# Patient Record
Sex: Male | Born: 1965 | Race: White | Hispanic: No | Marital: Single | Smoking: Never smoker
Health system: Southern US, Community
[De-identification: ages and names within clinical notes are randomized; demographics above are authoritative.]

## PROBLEM LIST (undated history)

## (undated) DIAGNOSIS — T7840XA Allergy, unspecified, initial encounter: Secondary | ICD-10-CM

## (undated) DIAGNOSIS — R079 Chest pain, unspecified: Secondary | ICD-10-CM

## (undated) DIAGNOSIS — G4700 Insomnia, unspecified: Secondary | ICD-10-CM

## (undated) DIAGNOSIS — Z87442 Personal history of urinary calculi: Secondary | ICD-10-CM

## (undated) DIAGNOSIS — S5420XA Injury of radial nerve at forearm level, unspecified arm, initial encounter: Secondary | ICD-10-CM

## (undated) DIAGNOSIS — I10 Essential (primary) hypertension: Secondary | ICD-10-CM

## (undated) HISTORY — PX: LITHOTRIPSY: SUR834

## (undated) HISTORY — DX: Insomnia, unspecified: G47.00

## (undated) HISTORY — DX: Chest pain, unspecified: R07.9

## (undated) HISTORY — DX: Injury of radial nerve at forearm level, unspecified arm, initial encounter: S54.20XA

## (undated) HISTORY — PX: VASECTOMY: SHX75

## (undated) HISTORY — PX: URETEROSCOPY WITH HOLMIUM LASER LITHOTRIPSY: SHX6645

## (undated) HISTORY — DX: Allergy, unspecified, initial encounter: T78.40XA

## (undated) HISTORY — DX: Essential (primary) hypertension: I10

---
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
## 1999-08-19 HISTORY — PX: NASAL SEPTUM SURGERY: SHX37

## 2007-08-19 DIAGNOSIS — D126 Benign neoplasm of colon, unspecified: Secondary | ICD-10-CM

## 2007-08-19 HISTORY — DX: Benign neoplasm of colon, unspecified: D12.6

## 2008-02-28 ENCOUNTER — Ambulatory Visit (HOSPITAL_COMMUNITY): Admission: RE | Admit: 2008-02-28 | Discharge: 2008-02-28 | Payer: Self-pay | Admitting: Urology

## 2008-09-27 ENCOUNTER — Emergency Department (HOSPITAL_BASED_OUTPATIENT_CLINIC_OR_DEPARTMENT_OTHER): Admission: EM | Admit: 2008-09-27 | Discharge: 2008-09-27 | Payer: Self-pay | Admitting: Emergency Medicine

## 2018-08-19 ENCOUNTER — Encounter (HOSPITAL_COMMUNITY)
Admission: RE | Disposition: A | Discharge status: Home / Self Care | Payer: Self-pay | Source: Other Acute Inpatient Hospital | Attending: Urology

## 2018-08-19 ENCOUNTER — Ambulatory Visit (HOSPITAL_COMMUNITY)
Admission: RE | Admit: 2018-08-19 | Discharge: 2018-08-19 | Disposition: A | Discharge status: Home / Self Care | Payer: BLUE CROSS/BLUE SHIELD | Source: Other Acute Inpatient Hospital | Attending: Urology | Admitting: Urology

## 2018-08-19 ENCOUNTER — Other Ambulatory Visit: Payer: Self-pay | Admitting: Urology

## 2018-08-19 ENCOUNTER — Encounter (HOSPITAL_COMMUNITY): Payer: Self-pay | Admitting: General Practice

## 2018-08-19 ENCOUNTER — Ambulatory Visit (HOSPITAL_COMMUNITY): Payer: BLUE CROSS/BLUE SHIELD

## 2018-08-19 DIAGNOSIS — N201 Calculus of ureter: Secondary | ICD-10-CM | POA: Diagnosis present

## 2018-08-19 HISTORY — DX: Personal history of urinary calculi: Z87.442

## 2018-08-19 HISTORY — PX: EXTRACORPOREAL SHOCK WAVE LITHOTRIPSY: SHX1557

## 2018-08-19 SURGERY — LITHOTRIPSY, ESWL
Anesthesia: Moderate Sedation

## 2018-08-19 MED ORDER — CIPROFLOXACIN HCL 500 MG PO TABS
500.0000 mg | ORAL_TABLET | ORAL | Status: AC
  Administered 2018-08-19: 500 mg via ORAL
  Filled 2018-08-19: qty 1

## 2018-08-19 MED ORDER — DIAZEPAM 5 MG PO TABS
10.0000 mg | ORAL_TABLET | ORAL | Status: AC
  Administered 2018-08-19: 10 mg via ORAL
  Filled 2018-08-19: qty 2

## 2018-08-19 MED ORDER — DIPHENHYDRAMINE HCL 25 MG PO CAPS
25.0000 mg | ORAL_CAPSULE | ORAL | Status: AC
  Administered 2018-08-19: 25 mg via ORAL
  Filled 2018-08-19: qty 1

## 2018-08-19 MED ORDER — SODIUM CHLORIDE 0.9 % IV SOLN
INTRAVENOUS | Status: DC
  Administered 2018-08-19: 14:00:00 via INTRAVENOUS

## 2018-08-19 NOTE — Discharge Instructions (Signed)

## 2018-08-20 ENCOUNTER — Encounter (HOSPITAL_COMMUNITY): Payer: Self-pay | Admitting: Urology

## 2019-06-30 IMAGING — CR DG ABDOMEN 1V
1 series · 1 of 1 positions shown · non-contrast
Comparison: 8866 hours today. CT Abdomen and Pelvis [REDACTED] 02/11/2008.

CLINICAL DATA: 52-year-old male with right kidney stone.
Preoperative.

EXAM:
ABDOMEN - 1 VIEW

[t abdomen supine]
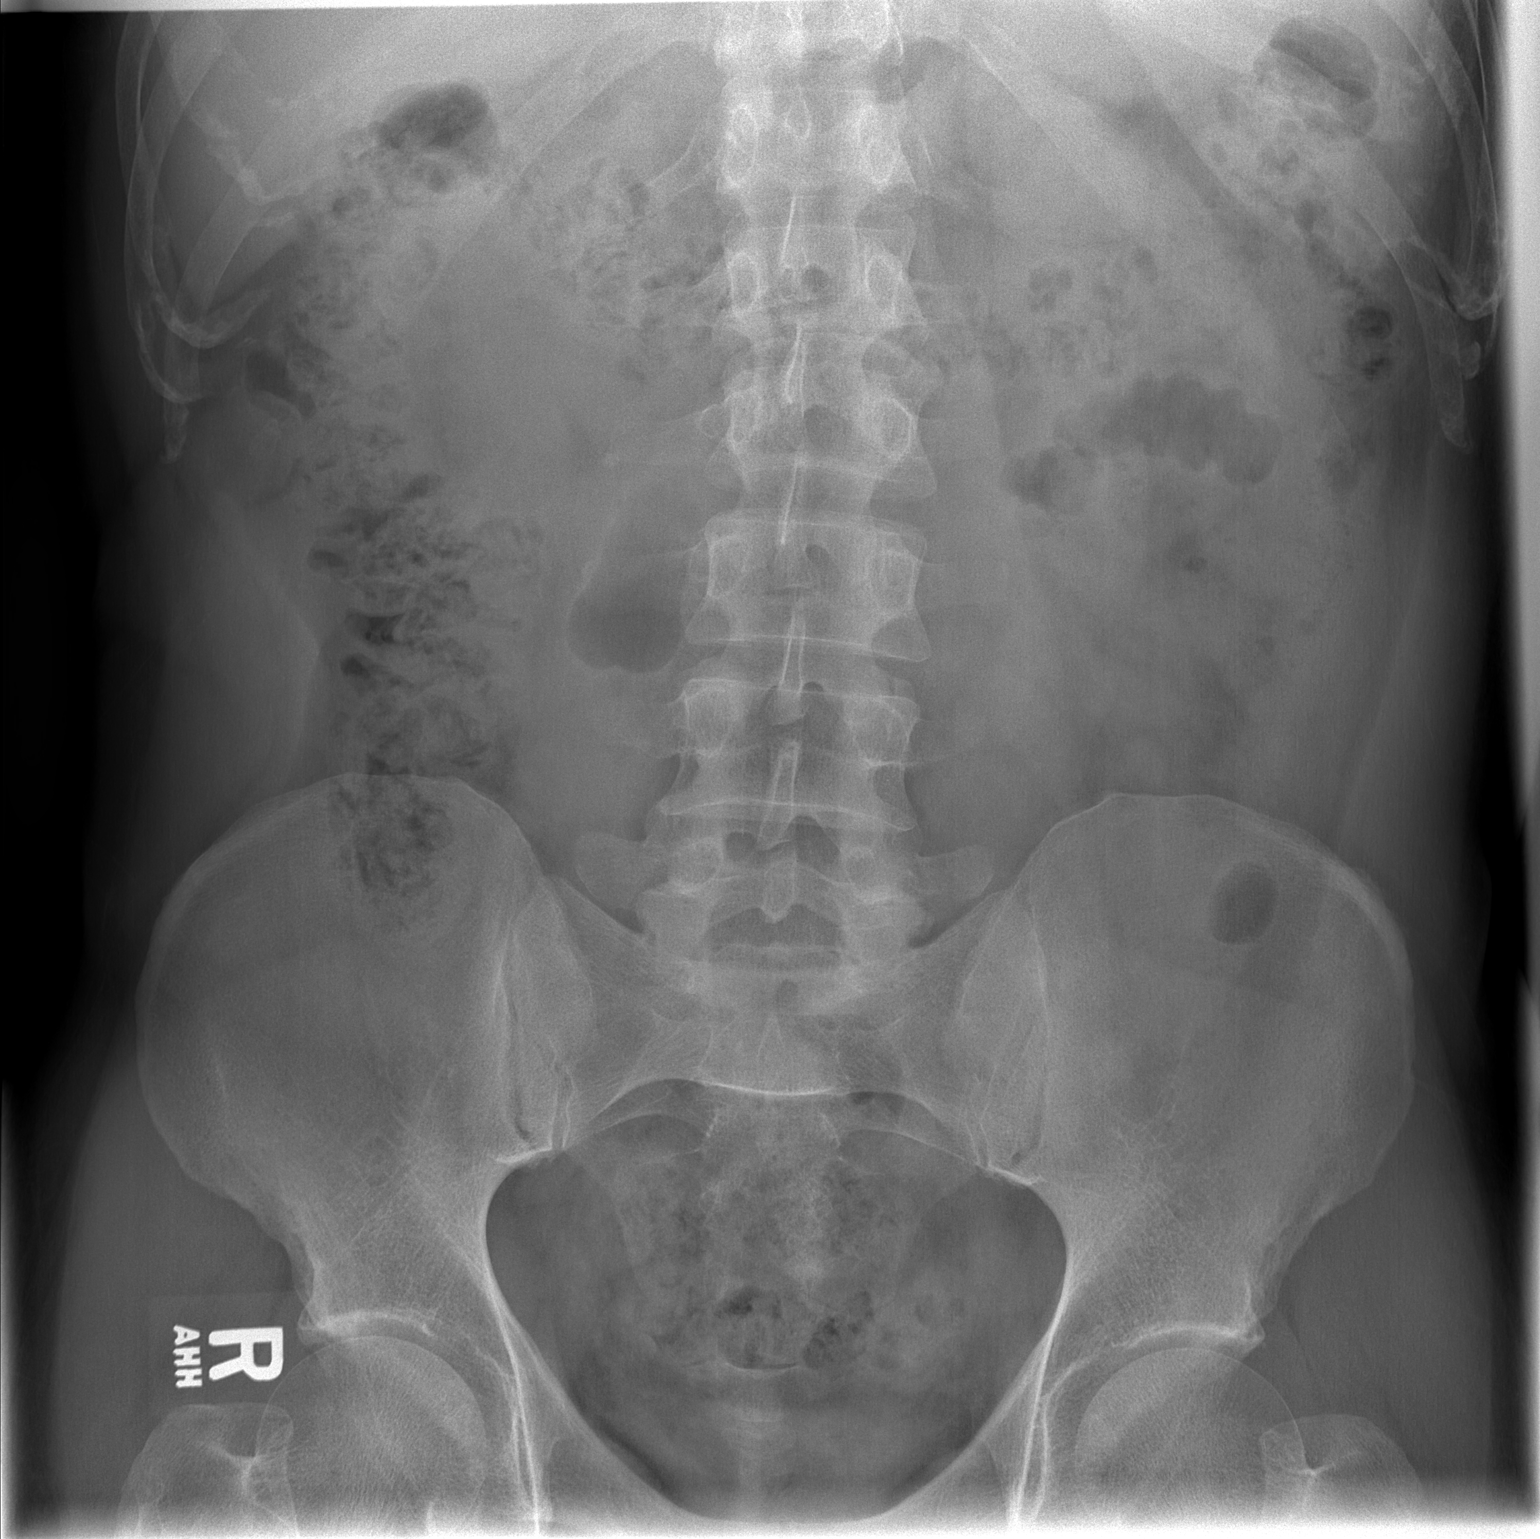

[1 of 1 positions shown; findings below may reference images not displayed]

FINDINGS: A round 4 millimeter calculus projects at the right L2 transverse
process along the expected course of the right ureter. No other
urologic calculus identified. Abdominal and pelvic visceral contours
and bowel gas pattern within normal limits. No acute osseous
abnormality identified.
IMPRESSION: 4 mm right ureteral calculus suspected at the L2 transverse process
level.

## 2020-02-22 ENCOUNTER — Other Ambulatory Visit: Payer: Self-pay

## 2020-02-22 ENCOUNTER — Encounter (INDEPENDENT_AMBULATORY_CARE_PROVIDER_SITE_OTHER): Payer: Self-pay | Admitting: Otolaryngology

## 2020-02-22 ENCOUNTER — Ambulatory Visit (INDEPENDENT_AMBULATORY_CARE_PROVIDER_SITE_OTHER): Payer: Managed Care, Other (non HMO) | Admitting: Otolaryngology

## 2020-02-22 VITALS — Temp 97.9°F

## 2020-02-22 DIAGNOSIS — J310 Chronic rhinitis: Secondary | ICD-10-CM

## 2020-02-22 NOTE — Progress Notes (Signed)
HPI: Jared Chen is a 54 y.o. male who presents for evaluation of intermittent nasal obstruction as well as some crusting and scabbing in his nose.  He had had previous septal surgery performed in [place] 20 years ago.  Over the past several months he has felt more nasal congestion especially on the left side but it will alternate from side to side.  He has also noticed some scabbing or crusting in the nose.  He feels like the septum has moved.  Past Medical History:  Diagnosis Date  . History of kidney stones    Past Surgical History:  Procedure Laterality Date  . EXTRACORPOREAL SHOCK WAVE LITHOTRIPSY N/A 08/19/2018   Procedure: EXTRACORPOREAL SHOCK WAVE LITHOTRIPSY (ESWL);  Surgeon: Cleon Gustin, MD;  Location: [facility];  Service: Urology;  Laterality: N/A;  . LITHOTRIPSY     multiple  . [place]   had collasped lung with surgery  . URETEROSCOPY WITH HOLMIUM LASER LITHOTRIPSY     x2,  . VASECTOMY  2002- or 2003   Social History   Socioeconomic History  . Marital status: Married    Spouse name: Not on file  . Number of children: Not on file  . Years of education: Not on file  . Highest education level: Not on file  Occupational History  . Not on file  Tobacco Use  . Smoking status: Never Smoker  . Smokeless tobacco: Never Used  Vaping Use  . Vaping Use: Never used  Substance and Sexual Activity  . Alcohol use: Not on file  . Drug use: Not on file  . Sexual activity: Not on file  Other Topics Concern  . Not on file  Social History Narrative  . Not on file   Social Determinants of Health   Financial Resource Strain:   . Difficulty of Paying Living Expenses:   Food Insecurity:   . Worried About Charity fundraiser in the Last Year:   . Arboriculturist in the Last Year:   Transportation Needs:   . Film/video editor (Medical):   Marland Kitchen Lack of Transportation (Non-Medical):   Physical Activity:   . Days of Exercise per Week:   . Minutes of  Exercise per Session:   Stress:   . Feeling of Stress :   Social Connections:   . Frequency of Communication with Friends and Family:   . Frequency of Social Gatherings with Friends and Family:   . Attends Religious Services:   . Active Member of Clubs or Organizations:   . Attends Archivist Meetings:   Marland Kitchen Marital Status:    No family history on file. Allergies  Allergen Reactions  . Demerol [Meperidine Hcl] Other (See Comments)    Explosive headache, vomiting   Prior to Admission medications   Not on File     Positive ROS: Otherwise negative  All other systems have been reviewed and were otherwise negative with the exception of those mentioned in the HPI and as above.  Physical Exam: Constitutional: Alert, well-appearing, no acute distress Ears: External ears without lesions or tenderness. Ear canals are clear bilaterally with intact, clear TMs.  Nasal: External nose without lesions.  Septum is relatively midline with no significant deviation.  Turbinates are mid size bilaterally and nasal passages are clear in the office today.  He does have some crusting along the septum little bit worse on the left side.  He also has some crusting along the inferior edge of  the nostril on both sides.  The nose was cleaned in the office today. Oral: Lips and gums without lesions. Tongue and palate mucosa without lesions. Posterior oropharynx clear. Neck: No palpable adenopathy or masses Respiratory: Breathing comfortably  Skin: No facial/neck lesions or rash noted.  Procedures  Assessment: Status post previous septoplasty which looks to be in good shape. Crusting and scabbing within the nose on both sides. Medium size turbinates I suspect this contributes to alternating congestion in his nose.  Plan: Recommended use of Nasacort 2 sprays each nostril at night as well as use of saline irrigation as needed. For the scabbing or crusting in the nose suggested use of mupirocin 2%  ointment application twice daily for 7 to 10 days. He will follow-up as needed.  If congestion becomes a major issue could consider further turbinate reduction.  Radene Journey, MD

## 2020-04-05 ENCOUNTER — Other Ambulatory Visit: Payer: Self-pay | Admitting: Gastroenterology

## 2020-04-05 ENCOUNTER — Other Ambulatory Visit (HOSPITAL_COMMUNITY): Payer: Self-pay | Admitting: Gastroenterology

## 2020-04-05 DIAGNOSIS — R1011 Right upper quadrant pain: Secondary | ICD-10-CM

## 2020-04-16 ENCOUNTER — Encounter (HOSPITAL_COMMUNITY): Payer: Managed Care, Other (non HMO)

## 2020-04-16 ENCOUNTER — Encounter (HOSPITAL_COMMUNITY): Payer: Self-pay

## 2020-05-10 ENCOUNTER — Telehealth: Payer: Self-pay | Admitting: Gastroenterology

## 2020-05-10 NOTE — Telephone Encounter (Addendum)
Hi Dr. Fuller Plan,  Patient is requesting to transfer care over to you was referred by a few friends that are under your care. Patient is currently with [facility] and is having upper right abdominal pain also stated he has had gallbladder issues in the past. Sending records to you for review.  Please advise on scheduling.   Thank you

## 2020-05-11 ENCOUNTER — Other Ambulatory Visit: Payer: Self-pay | Admitting: Nurse Practitioner

## 2020-05-11 DIAGNOSIS — R1011 Right upper quadrant pain: Secondary | ICD-10-CM

## 2020-05-15 ENCOUNTER — Other Ambulatory Visit: Payer: Self-pay

## 2020-05-15 ENCOUNTER — Ambulatory Visit (INDEPENDENT_AMBULATORY_CARE_PROVIDER_SITE_OTHER): Payer: Managed Care, Other (non HMO)

## 2020-05-15 DIAGNOSIS — R1011 Right upper quadrant pain: Secondary | ICD-10-CM | POA: Diagnosis not present

## 2020-05-15 MED ORDER — IOHEXOL 300 MG/ML  SOLN
100.0000 mL | Freq: Once | INTRAMUSCULAR | Status: AC | PRN
  Administered 2020-05-15: 100 mL via INTRAVENOUS

## 2020-07-17 ENCOUNTER — Ambulatory Visit: Payer: Managed Care, Other (non HMO) | Admitting: Gastroenterology

## 2020-07-17 ENCOUNTER — Encounter: Payer: Self-pay | Admitting: Gastroenterology

## 2020-07-17 ENCOUNTER — Telehealth: Payer: Self-pay | Admitting: Gastroenterology

## 2020-07-17 ENCOUNTER — Other Ambulatory Visit: Payer: Self-pay | Admitting: Gastroenterology

## 2020-07-17 VITALS — BP 140/90 | HR 88 | Ht 69.5 in | Wt 197.0 lb

## 2020-07-17 DIAGNOSIS — R748 Abnormal levels of other serum enzymes: Secondary | ICD-10-CM | POA: Diagnosis not present

## 2020-07-17 DIAGNOSIS — R109 Unspecified abdominal pain: Secondary | ICD-10-CM

## 2020-07-17 DIAGNOSIS — Z8371 Family history of colonic polyps: Secondary | ICD-10-CM | POA: Diagnosis not present

## 2020-07-17 DIAGNOSIS — Z86010 Personal history of colonic polyps: Secondary | ICD-10-CM | POA: Diagnosis not present

## 2020-07-17 MED ORDER — GLYCOPYRROLATE 1 MG PO TABS: 1.0000 mg | ORAL_TABLET | Freq: Two times a day (BID) | ORAL | 11 refills | Status: DC

## 2020-07-17 NOTE — Telephone Encounter (Signed)
Patient called, stating he was returning your call.  Please call him again.

## 2020-07-17 NOTE — Addendum Note (Signed)
Addended by: Dorisann Frames L on: 07/17/2020 01:50 PM   Modules accepted: Orders

## 2020-07-17 NOTE — Progress Notes (Signed)
History of Present Illness: This is a 54 year old male referred by Christella Noa, MD for the evaluation of right-sided abdominal pain and elevated alkaline phosphatase.  He was previously followed by Dr. Otis Brace with [facility] and is transferring care.  He relates a 84-monthhistory of right mid abdominal pain that generally follows meals but does bother him at other times.  It frequently occurs with larger meals or overeating.  Colonoscopy and EGD in 2019 as below.  Recent evaluation with ultrasound, HIDA and CT abdomen and pelvis unremarkable.  He states his symptoms have improved over the past few weeks and he is now having them intermittently.  Initially symptoms were almost on a daily basis.  Alkaline phosphatase of 155 was noted in August with normal range to 121. Other LFTs were normal.  Repeat alk phos in August was 149.  Gamma GT normal, ASMA negative, AMA negative, HCV antibody negative, HBsAg negative. Denies weight loss, constipation, diarrhea, change in stool caliber, melena, hematochezia, nausea, vomiting, dysphagia, reflux symptoms, chest pain.   CT AP 05/15/2020 negative HIDA 05/07/2020 EF=84%  RUQ [place]20[place] negative  EGD July 2019  Nonobstructive Schatkzi ring, small HH, gastritis Biopsies negative for H pylori and EoE Colonoscopy July 2019 2 tubular adenomas    Allergies  Allergen Reactions  . Demerol [Meperidine Hcl] Other (See Comments)    Explosive headache, vomiting   Outpatient Medications Prior to Visit  Medication Sig Dispense Refill  . Multiple Vitamin (MULTIVITAMIN) tablet Take 1 tablet by mouth daily.    . Omega-3 Fatty Acids (FISH OIL) 1000 MG CAPS Take 2 capsules by mouth daily.     No facility-administered medications prior to visit.   Past Medical History:  Diagnosis Date  . Adenomatous colon polyp 2009  . History of kidney stones   . Insomnia   . Radial nerve injury    Past Surgical History:  Procedure Laterality Date  . EXTRACORPOREAL  SHOCK WAVE LITHOTRIPSY N/A 08/19/2018   Procedure: EXTRACORPOREAL SHOCK WAVE LITHOTRIPSY (ESWL);  Surgeon: MCleon Gustin MD;  Location: [facility];  Service: Urology;  Laterality: N/A;  . LITHOTRIPSY     multiple  . [place]AL SE[place]  [place] with surgery  . URETEROSCOPY WITH HOLMIUM LASER LITHOTRIPSY     x2,  . VASECTOMY  2002- or 2003   Social History   Socioeconomic History  . Marital status: Married    Spouse name: Not on file  . Number of children: Not on file  . Years of education: Not on file  . Highest education level: Not on file  Occupational History  . Not on file  Tobacco Use  . Smoking status: Never Smoker  . Smokeless tobacco: Never Used  Vaping Use  . Vaping Use: Never used  Substance and Sexual Activity  . Alcohol use: Never  . Drug use: Never  . Sexual activity: Not on file  Other Topics Concern  . Not on file  Social History Narrative  . Not on file   Social Determinants of Health   Financial Resource Strain:   . Difficulty of Paying Living Expenses: Not on file  Food Insecurity:   . Worried About RCharity fundraiserin the Last Year: Not on file  . Ran Out of Food in the Last Year: Not on file  Transportation Needs:   . Lack of Transportation (Medical): Not on file  . Lack of  Transportation (Non-Medical): Not on file  Physical Activity:   . Days of Exercise per Week: Not on file  . Minutes of Exercise per Session: Not on file  Stress:   . Feeling of Stress : Not on file  Social Connections:   . Frequency of Communication with Friends and Family: Not on file  . Frequency of Social Gatherings with Friends and Family: Not on file  . Attends Religious Services: Not on file  . Active Member of Clubs or Organizations: Not on file  . Attends Archivist Meetings: Not on file  . Marital Status: Not on file   Family History  Problem Relation Age of Onset  . Depression Mother   . Breast cancer  Maternal Grandmother   . Arthritis Maternal Grandmother   . Hypertension Maternal Grandmother   . Colon cancer Maternal Grandfather   . Arthritis Maternal Grandfather   . Hypertension Maternal Grandfather   . Alzheimer's disease Maternal Grandfather   . Leukemia Paternal Grandmother       Review of Systems: Pertinent positive and negative review of systems were noted in the above HPI section. All other review of systems were otherwise negative.   Physical Exam: General: Well developed, well nourished, no acute distress Head: Normocephalic and atraumatic Eyes:  sclerae anicteric, EOMI Ears: Normal auditory acuity Mouth: Not examined, mask on during Covid-19 pandemic Neck: Supple, no masses or thyromegaly Lungs: Clear throughout to auscultation Heart: Regular rate and rhythm; no murmurs, rubs or bruits Abdomen: Soft, non tender and non distended. Right abdomen/right flank well healed scar. No masses, hepatosplenomegaly or hernias noted. Normal Bowel sounds Rectal: Not done Musculoskeletal: Symmetrical with no gross deformities  Skin: No lesions on visible extremities Pulses:  Normal pulses noted Extremities: No clubbing, cyanosis, edema or deformities noted Neurological: Alert oriented x 4, grossly nonfocal Cervical Nodes:  No significant cervical adenopathy Inguinal Nodes: No significant inguinal adenopathy Psychological:  Alert and cooperative. Normal mood and affect   Assessment and Recommendations:  1. Right sided abdominal pain.  Rule out celiac disease, dyspepsia, functional abdominal pain, adhesions, occult biliary disease. Check tTG and IgA.  Trial of glycopyrrolate 1 mg p.o. twice daily.  Advised to avoid overeating and, if possible, avoid foods that frequently cause symptoms. Keep track of foods, beverages that might be causing symptoms.  If symptoms are not controlled with glycopyrrolate consider changing antispasmodics and consider EGD.  REV in 6 weeks.   2. Mildly  elevated alk phos. Repeat LFTs and obtain alkaline phosphatase isoenzymes.  3. Personal history of adenomatous colon polyps, family history of colon polyps, mother, family history of colon cancer, MGF and M uncle. Surveillance colonoscopy in July 2024.   cc: Christella Noa, MD

## 2020-07-17 NOTE — Addendum Note (Signed)
Addended by: Dorisann Frames L on: 07/17/2020 02:38 PM   Modules accepted: Orders

## 2020-07-17 NOTE — Patient Instructions (Signed)
Your provider has requested that you get labs drawn. Per your request, you will take the lab order requisitions to [facility]. Please have them fax results to [place] at [phone].  We have sent the following medications to your pharmacy for you to pick up at your convenience: glycopyrrolate.   Thank you for choosing me and [facility].  Pricilla Riffle. Dagoberto Ligas., MD., [place] Regal

## 2020-07-17 NOTE — Telephone Encounter (Signed)
Informed patient I have ordered a test that was put in error. Asked patient if he can come by the [place] lab again to have the right test drawn. Informed patient that I will credit the test I ordered in error. Patient states he works for [employer] and he lab work is free. He said to just email him the new lab order requisition and he will go by the lab again. Thanks patient and told him I will email the requisition today. Informed patient to contact our office if he does not receive it. Patient verbalized understanding.

## 2020-07-19 LAB — HEPATIC FUNCTION PANEL
ALT: 36 IU/L (ref 0–44)
AST: 32 IU/L (ref 0–40)
Albumin: 4.8 g/dL (ref 3.8–4.9)
Alkaline Phosphatase: 143 IU/L — ABNORMAL HIGH (ref 44–121)
Bilirubin Total: 0.6 mg/dL (ref 0.0–1.2)
Bilirubin, Direct: 0.14 mg/dL (ref 0.00–0.40)
Total Protein: 6.8 g/dL (ref 6.0–8.5)

## 2020-07-19 LAB — AMYLASE ISOENZYMES
Amylase: 79 U/L (ref 31–110)
Pancreatic Amylase, S: 58 U/L — ABNORMAL HIGH (ref 14–55)
Salivary Amyl. Calc.: 21 U/L (ref 11–83)

## 2020-07-19 LAB — TISSUE TRANSGLUTAMINASE, IGA: Transglutaminase IgA: <2 U/mL (ref 0–3)

## 2020-07-19 LAB — IGA: IgA/Immunoglobulin A, Serum: 192 mg/dL (ref 90–386)

## 2020-07-24 ENCOUNTER — Other Ambulatory Visit: Payer: Self-pay

## 2020-07-24 DIAGNOSIS — R748 Abnormal levels of other serum enzymes: Secondary | ICD-10-CM

## 2020-07-24 LAB — ALKALINE PHOSPHATASE, ISOENZYMES
Alkaline Phosphatase: 152 IU/L — ABNORMAL HIGH (ref 44–121)
BONE FRACTION: 57 % (ref 12–68)
INTESTINAL FRAC.: 10 % (ref 0–18)
LIVER FRACTION: 33 % (ref 13–88)

## 2020-08-29 ENCOUNTER — Ambulatory Visit: Payer: Managed Care, Other (non HMO) | Admitting: Gastroenterology

## 2020-09-25 ENCOUNTER — Ambulatory Visit: Payer: Managed Care, Other (non HMO) | Admitting: Gastroenterology

## 2020-09-25 ENCOUNTER — Encounter: Payer: Self-pay | Admitting: Gastroenterology

## 2020-09-25 VITALS — BP 130/100 | HR 88 | Ht 69.5 in | Wt 206.0 lb

## 2020-09-25 DIAGNOSIS — R1011 Right upper quadrant pain: Secondary | ICD-10-CM | POA: Diagnosis not present

## 2020-09-25 DIAGNOSIS — R748 Abnormal levels of other serum enzymes: Secondary | ICD-10-CM | POA: Diagnosis not present

## 2020-09-25 MED ORDER — DICYCLOMINE HCL 10 MG PO CAPS: 10.0000 mg | ORAL_CAPSULE | Freq: Three times a day (TID) | ORAL | 11 refills | Status: DC | PRN

## 2020-09-25 NOTE — Progress Notes (Signed)
    History of Present Illness: This is a 55 year old male returning for follow-up of right upper quadrant pain and an elevated alkaline phosphatase.  He has adjusted his diet and feels he is under less stress and he has noted a substantial improvement in his symptoms.  He notes that Robinul was moderately effective in reducing his pain however he had significant dry mouth and so he discontinued it.   Current Medications, Allergies, Past Medical History, Past Surgical History, Family History and Social History were reviewed in [employer] record.   Physical Exam: General: Well developed, well nourished, no acute distress Head: Normocephalic and atraumatic Eyes: Sclerae anicteric, EOMI Ears: Normal auditory acuity Mouth: Not examined, mask on during Covid-19 pandemic Lungs: Clear throughout to auscultation Heart: Regular rate and rhythm; no murmurs, rubs or bruits Abdomen: Soft, non tender and non distended. No masses, hepatosplenomegaly or hernias noted. Normal Bowel sounds Rectal: Not done Musculoskeletal: Symmetrical with no gross deformities  Pulses:  Normal pulses noted Extremities: No clubbing, cyanosis, edema or deformities noted Neurological: Alert oriented x 4, grossly nonfocal Psychological:  Alert and cooperative. Normal mood and affect   Assessment and Recommendations:  1.  Right-sided abdominal pain.  Continue with current diet adjustments.  If additional foods or beverages are found to aggravate symptoms he will adjust his diet as appropriate.  Trial of dicyclomine 10 mg p.o. 3 times daily taken 30 minutes before meals as needed.  If this is not effective or he notes side effects trial of IBgard 1-2 p.o. 3 times daily 30 minutes AC as needed. REV in 1 year.   2.  Mildly elevated alkaline phosphatase with normal liver bone and intestinal fractions and normal CT AP.  Repeat LFTs today.

## 2020-09-25 NOTE — Patient Instructions (Addendum)
Your provider has requested that you go to the basement level for lab work before leaving today. Press "B" on the elevator. The lab is located at the first door on the left as you exit the elevator.  We have sent the following medications to your pharmacy for you to pick up at your convenience: dicyclomine.   If you do not see improvement in your symptoms, then start over the counter IBgard 1-2 capsules daily as needed.   Due to recent changes in healthcare laws, you may see the results of your imaging and laboratory studies on MyChart before your provider has had a chance to review them.  We understand that in some cases there may be results that are confusing or concerning to you. Not all laboratory results come back in the same time frame and the provider may be waiting for multiple results in order to interpret others.  Please give [place] 48 hours in order for your provider to thoroughly review all the results before contacting the office for clarification of your results.   Thank you for choosing me and [facility].  Pricilla Riffle. Dagoberto Ligas., MD., [place] Regal

## 2021-05-21 ENCOUNTER — Encounter (HOSPITAL_BASED_OUTPATIENT_CLINIC_OR_DEPARTMENT_OTHER): Payer: Self-pay | Admitting: Emergency Medicine

## 2021-05-21 ENCOUNTER — Other Ambulatory Visit: Payer: Self-pay

## 2021-05-21 ENCOUNTER — Emergency Department (HOSPITAL_BASED_OUTPATIENT_CLINIC_OR_DEPARTMENT_OTHER): Payer: Managed Care, Other (non HMO)

## 2021-05-21 ENCOUNTER — Emergency Department (HOSPITAL_BASED_OUTPATIENT_CLINIC_OR_DEPARTMENT_OTHER)
Admission: EM | Admit: 2021-05-21 | Discharge: 2021-05-21 | Disposition: A | Discharge status: Home / Self Care | Payer: Managed Care, Other (non HMO) | Attending: Emergency Medicine | Admitting: Emergency Medicine

## 2021-05-21 DIAGNOSIS — R072 Precordial pain: Secondary | ICD-10-CM | POA: Insufficient documentation

## 2021-05-21 DIAGNOSIS — R079 Chest pain, unspecified: Secondary | ICD-10-CM

## 2021-05-21 DIAGNOSIS — Z79899 Other long term (current) drug therapy: Secondary | ICD-10-CM | POA: Insufficient documentation

## 2021-05-21 LAB — HEPATIC FUNCTION PANEL
ALT: 22 U/L (ref 0–44)
AST: 18 U/L (ref 15–41)
Albumin: 4.6 g/dL (ref 3.5–5.0)
Alkaline Phosphatase: 111 U/L (ref 38–126)
Bilirubin, Direct: 0.2 mg/dL (ref 0.0–0.2)
Indirect Bilirubin: 0.5 mg/dL (ref 0.3–0.9)
Total Bilirubin: 0.7 mg/dL (ref 0.3–1.2)
Total Protein: 7.9 g/dL (ref 6.5–8.1)

## 2021-05-21 LAB — BASIC METABOLIC PANEL
Anion gap: 10 (ref 5–15)
BUN: 19 mg/dL (ref 6–20)
CO2: 26 mmol/L (ref 22–32)
Calcium: 9.7 mg/dL (ref 8.9–10.3)
Chloride: 102 mmol/L (ref 98–111)
Creatinine, Ser: 1.06 mg/dL (ref 0.61–1.24)
GFR, Estimated: >60 mL/min (ref >60)
Glucose, Bld: 110 mg/dL — ABNORMAL HIGH (ref 70–99)
Potassium: 4 mmol/L (ref 3.5–5.1)
Sodium: 138 mmol/L (ref 135–145)

## 2021-05-21 LAB — CBC
HCT: 49.2 % (ref 39.0–52.0)
Hemoglobin: 16.3 g/dL (ref 13.0–17.0)
MCH: 29.9 pg (ref 26.0–34.0)
MCHC: 33.1 g/dL (ref 30.0–36.0)
MCV: 90.3 fL (ref 80.0–100.0)
Platelets: 223 10*3/uL (ref 150–400)
RBC: 5.45 MIL/uL (ref 4.22–5.81)
RDW: 13.3 % (ref 11.5–15.5)
WBC: 6.7 10*3/uL (ref 4.0–10.5)
nRBC: 0 % (ref 0.0–0.2)

## 2021-05-21 LAB — TROPONIN I (HIGH SENSITIVITY)
Troponin I (High Sensitivity): 3 ng/L (ref <18)
Troponin I (High Sensitivity): 3 ng/L

## 2021-05-21 LAB — LIPASE, BLOOD: Lipase: 13 U/L (ref 11–51)

## 2021-05-21 LAB — CBG MONITORING, ED: Glucose-Capillary: 119 mg/dL — ABNORMAL HIGH (ref 70–99)

## 2021-05-21 MED ORDER — LIDOCAINE VISCOUS HCL 2 % MT SOLN
15.0000 mL | Freq: Once | OROMUCOSAL | Status: AC
  Administered 2021-05-21: 15 mL via ORAL
  Filled 2021-05-21: qty 15

## 2021-05-21 MED ORDER — ALUM & MAG HYDROXIDE-SIMETH 200-200-20 MG/5ML PO SUSP
30.0000 mL | Freq: Once | ORAL | Status: AC
  Administered 2021-05-21: 30 mL via ORAL
  Filled 2021-05-21: qty 30

## 2021-05-21 NOTE — Discharge Instructions (Addendum)
Please follow-up with your primary care provider for consideration for cardiac stress testing outpatient.  Additionally consider further discussion of options regarding chronic blood pressure management

## 2021-05-21 NOTE — ED Triage Notes (Signed)
Pt arrives to ED with c/o of chest pain that started today. The CP is dull and tight. The CP is consistent. Pt reports his acid reflex has worsened over the past x4 days and this weekend he noticed to have polyuria. He reports being congested. No rhinorrhea, sore throat, post nasal drip. No SOB.

## 2021-05-21 NOTE — ED Provider Notes (Signed)
[facility] EMERGENCY DEPT Provider Note   CSN: [id number] Arrival date & time: 05/21/21  0754     History Chief Complaint  Patient presents with   Chest Pain    Jared Chen is a 55 y.o. male.   Chest Pain Pain location:  Substernal area and epigastric Pain quality: pressure and tightness   Pain radiates to:  Does not radiate Pain severity:  Mild Onset quality:  Sudden Timing:  Constant Progression:  Unchanged Chronicity:  New Context: at rest   Context: not breathing and not movement   Relieved by:  Nothing Worsened by:  Nothing Ineffective treatments:  None tried Associated symptoms: heartburn   Associated symptoms: no altered mental status, no back pain, no cough, no diaphoresis, no dizziness, no fever, no headache, no lower extremity edema, no nausea, no shortness of breath, no syncope and no vomiting   Risk factors: high cholesterol and male sex   Risk factors: no coronary artery disease, no diabetes mellitus, no hypertension and no smoking    55 year old male with medical history of below presenting to the emergency department with chest pressure.  The patient states that over the past 4 days he noticed some "indigestion.  He did endorse some epigastric and generalized abdominal crampy discomfort over that time. He was seen in urgent care yesterday where he was swabbed for COVID and flu which resulted negative. He presents today more for chest pressure that came on while driving to work.  He endorses a tightness and heaviness which is new.  He denies any chest pain with exertion.  He denies any shortness of breath, nausea, vomiting, diaphoresis, abdominal pain.  He denies any fevers or chills.  There are no aggravating or alleviating factors.  Past Medical History:  Diagnosis Date   Adenomatous colon polyp 2009   History of kidney stones    Insomnia    Radial nerve injury     There are no problems to display for this patient.   Past Surgical  History:  Procedure Laterality Date   EXTRACORPOREAL SHOCK WAVE LITHOTRIPSY N/A 08/19/2018   Procedure: EXTRACORPOREAL SHOCK WAVE LITHOTRIPSY (ESWL);  Surgeon: Cleon Gustin, MD;  Location: [facility];  Service: Urology;  Laterality: N/A;   LITHOTRIPSY     multiple   NASAL SEPTUM SURGERY  2001   [place]   had collasped lung with surgery   URETEROSCOPY WITH HOLMIUM LASER LITHOTRIPSY     x2,   VASECTOMY  2002- or 2003       Family History  Problem Relation Age of Onset   Depression Mother    Breast cancer Maternal Grandmother    Arthritis Maternal Grandmother    Hypertension Maternal Grandmother    Colon cancer Maternal Grandfather    Arthritis Maternal Grandfather    Hypertension Maternal Grandfather    Alzheimer's disease Maternal Grandfather    Leukemia Paternal Grandmother     Social History   Tobacco Use   Smoking status: Never   Smokeless tobacco: Never  Vaping Use   Vaping Use: Never used  Substance Use Topics   Alcohol use: Never   Drug use: Never    Home Medications Prior to Admission medications   Medication Sig Start Date End Date Taking? Authorizing Provider  acetaminophen (TYLENOL) 325 MG tablet as needed.    [provider]  celecoxib (CELEBREX) 200 MG capsule as needed. 05/29/15   [provider]  dicyclomine (BENTYL) 10 MG capsule Take 1 capsule (10 mg  total) by mouth 3 (three) times daily as needed for spasms. 09/25/20   Ladene Artist, MD  Multiple Vitamin (MULTIVITAMIN) tablet Take 1 tablet by mouth daily.    [provider]  Omega-3 Fatty Acids (FISH OIL) 1000 MG CAPS Take 2 capsules by mouth daily.    [provider]    Allergies    Demerol [meperidine hcl]  Review of Systems   Review of Systems  Constitutional:  Negative for diaphoresis and fever.  Respiratory:  Negative for cough and shortness of breath.   Cardiovascular:  Positive for chest pain. Negative for syncope.  Gastrointestinal:   Positive for heartburn. Negative for nausea and vomiting.  Musculoskeletal:  Negative for back pain.  Neurological:  Negative for dizziness and headaches.  All other systems reviewed and are negative.  Physical Exam Updated Vital Signs BP (!) 145/100   Pulse 80   Temp 97.9 F (36.6 C) (Oral)   Resp 18   Ht 5\' 9"  (1.753 m)   Wt 90.7 kg   SpO2 98%   BMI 29.53 kg/m   Physical Exam Vitals and nursing note reviewed.  Constitutional:      General: He is not in acute distress.    Appearance: He is well-developed.  HENT:     Head: Normocephalic and atraumatic.  Eyes:     Conjunctiva/sclera: Conjunctivae normal.     Pupils: Pupils are equal, round, and reactive to light.  Cardiovascular:     Rate and Rhythm: Normal rate and regular rhythm.     Heart sounds: Normal heart sounds. No murmur heard. Pulmonary:     Effort: Pulmonary effort is normal. No respiratory distress.     Breath sounds: Normal breath sounds.  Abdominal:     General: There is no distension.     Palpations: Abdomen is soft.     Tenderness: There is no abdominal tenderness. There is no guarding.  Musculoskeletal:        General: No deformity or signs of injury.     Cervical back: Normal range of motion and neck supple.  Skin:    General: Skin is warm and dry.     Findings: No lesion or rash.  Neurological:     General: No focal deficit present.     Mental Status: He is alert. Mental status is at baseline.    ED Results / Procedures / Treatments   Labs (all labs ordered are listed, but only abnormal results are displayed) Labs Reviewed  BASIC METABOLIC PANEL - Abnormal; Notable for the following components:      Result Value   Glucose, Bld 110 (*)    All other components within normal limits  CBG MONITORING, ED - Abnormal; Notable for the following components:   Glucose-Capillary 119 (*)    All other components within normal limits  CBC  LIPASE, BLOOD  HEPATIC FUNCTION PANEL  TROPONIN I (HIGH  SENSITIVITY)  TROPONIN I (HIGH SENSITIVITY)    EKG EKG Interpretation  Date/Time:  Tuesday May 21 2021 08:03:42 EDT Ventricular Rate:  83 PR Interval:  173 QRS Duration: 101 QT Interval:  375 QTC Calculation: 441 R Axis:   -36 Text Interpretation: Sinus rhythm Left axis deviation Confirmed by Regan Lemming ([id number]) on 05/21/2021 8:04:45 AM  Radiology DG Chest Portable 1 View  Result Date: 05/21/2021 CLINICAL DATA:  Pt arrives to ED with c/o of chest pain that started today. The CP is dull and tight. The CP is consistent. Pt reports his acid reflex  has worsened over the past x4 days and this weekend he noticed to have polyuria. He reports being congested. EXAM: PORTABLE CHEST 1 VIEW COMPARISON:  12/11/2010 FINDINGS: Cardiac silhouette is normal in size and configuration. Normal mediastinal and hilar contours. Lungs are clear.  No pleural effusion or pneumothorax. Skeletal structures are grossly intact. IMPRESSION: No active disease. Electronically Signed   By: Lajean Manes M.D.   On: 05/21/2021 08:26    Procedures Procedures   Medications Ordered in ED Medications  alum & mag hydroxide-simeth (MAALOX/MYLANTA) 200-200-20 MG/5ML suspension 30 mL (30 mLs Oral Given 05/21/21 0916)    And  lidocaine (XYLOCAINE) 2 % viscous mouth solution 15 mL (15 mLs Oral Given 05/21/21 9233)    ED Course  I have reviewed the triage vital signs and the nursing notes.  Pertinent labs & imaging results that were available during my care of the patient were reviewed by me and considered in my medical decision making (see chart for details).    MDM Rules/Calculators/A&P HEAR Score: 3                         Jared Chen is a 55 y.o. male who presented to the Emergency Department c/o chest pain. Past medical records have been reviewed and are notable for no significant cardiac hx.   Pertinent exam findings include:well appearing, NAD, lungs CTAB, abd s/nt/nd, heart sounds rrr no m/r/g.  On arrival,  the patient was afebrile, mildly hypertensive BP 158/116, not tachycardic or tachypneic, saturating 100% on room air.  EKG: Normal sinus rhythm with a rate of 83 and no evidence of acute ischemic changes, abnormal intervals, or dysrhythmia. No priors for comparison.   Lab results include: Blood glucose 119, lipase normal, CBC without anemia, leukocytosis or platelet abnormality, troponins x2 normal, BMP unremarkable, hepatic function panel unremarkable.  Imaging results include: Chest x-ray without active disease.  Course of tx has consisted of: Viscous Maalox and lidocaine.   Labs unremarkable.  Unlikely pneumonia, no cough, no leukocytosis, no fevers, CXR and exam without acute findings. Unlikely pneumothorax, no findings on  CXR. Unlikely pericarditis/myocarditis, does not fit clinical picture. Chest pain not exertional or pleuritic. Unlikely dissection, no pulse deficit, no tearing chest pain, no neurologic complaints. Due to patient's high risk will rule out ACS. Symptoms not consistent with PE.  Thought process: Patient presenting with symptoms of chest pressure earlier today.  No known history of exertional chest pain symptoms.  Differential diagnosis includes: ACS, pneumonia, pneumothorax,pericarditis/myocarditis, GERD, PUD, musculoskeletal, anxiety. HEART score of 3, low risk.   Patient's clinical presentation is most consistent with chest pain, unspecified type.  I discussed with the patient a plan for follow-up with his PCP to discuss further provocative testing such as cardiac stress testing.  Provided return precautions in the event of worsening symptoms.  Patient was mildly hypertensive with a BP of 158/116 today.  Discussed further follow-up with his PCP for blood pressure recheck and in discussion regarding further options regarding chronic blood pressure management.  Given reassuring work-up today, feel the patient is overall stable for discharge and outpatient management.  Final  Clinical Impression(s) / ED Diagnoses Final diagnoses:  Chest pain, unspecified type    Rx / DC Orders ED Discharge Orders     None        Regan Lemming, MD 05/22/21 [phone]

## 2021-05-21 NOTE — ED Notes (Signed)
States medications has given some relief

## 2022-01-16 ENCOUNTER — Encounter: Payer: Self-pay | Admitting: Interventional Cardiology

## 2022-01-16 ENCOUNTER — Ambulatory Visit (INDEPENDENT_AMBULATORY_CARE_PROVIDER_SITE_OTHER): Payer: Managed Care, Other (non HMO) | Admitting: Interventional Cardiology

## 2022-01-16 VITALS — BP 130/70 | HR 72 | Ht 69.0 in | Wt 209.2 lb

## 2022-01-16 DIAGNOSIS — R072 Precordial pain: Secondary | ICD-10-CM | POA: Diagnosis not present

## 2022-01-16 DIAGNOSIS — R7303 Prediabetes: Secondary | ICD-10-CM

## 2022-01-16 DIAGNOSIS — I10 Essential (primary) hypertension: Secondary | ICD-10-CM | POA: Diagnosis not present

## 2022-01-16 MED ORDER — METOPROLOL TARTRATE 100 MG PO TABS: ORAL_TABLET | ORAL | 0 refills | Status: DC

## 2022-01-16 NOTE — Progress Notes (Signed)
Cardiology Office Note   Date:  01/16/2022   ID:  Jared Chen, DOB 1966/01/09, MRN [id number]  PCP:  Orpah Melter, MD    No chief complaint on file.  HTN, chest pain  Wt Readings from Last 3 Encounters:  01/16/22 209 lb 3.2 oz (94.9 kg)  05/21/21 200 lb (90.7 kg)  09/25/20 206 lb (93.4 kg)       History of Present Illness: Jared Chen is a 56 y.o. male who is being seen today for the evaluation of chest pain/HTN at the request of Orpah Melter, MD.   Had chest Tightness, GERD sx.  Felt poorly.  Negative w/u at [facility]  Was started on olmesartan.  Initially had severe fatigue, but this has improved.    Still has some occasional chest tightness.   Denies : Dizziness. Leg edema. Nitroglycerin use. Orthopnea. Palpitations. Paroxysmal nocturnal dyspnea.  Syncope.    Not getting regular exercise.    Past Medical History:  Diagnosis Date   Adenomatous colon polyp 2009   Chest pain    History of kidney stones    Insomnia    Radial nerve injury     Past Surgical History:  Procedure Laterality Date   EXTRACORPOREAL SHOCK WAVE LITHOTRIPSY N/A 08/19/2018   Procedure: EXTRACORPOREAL SHOCK WAVE LITHOTRIPSY (ESWL);  Surgeon: Cleon Gustin, MD;  Location: [facility];  Service: Urology;  Laterality: N/A;   LITHOTRIPSY     multiple   NASAL SEPTUM SURGERY  2001   [place]   had collasped lung with surgery   URETEROSCOPY WITH HOLMIUM LASER LITHOTRIPSY     x2,   VASECTOMY  2002- or 2003     Current Outpatient Medications  Medication Sig Dispense Refill   acetaminophen (TYLENOL) 325 MG tablet as needed.     celecoxib (CELEBREX) 200 MG capsule as needed.     dicyclomine (BENTYL) 10 MG capsule Take 1 capsule (10 mg total) by mouth 3 (three) times daily as needed for spasms. 90 capsule 11   Multiple Vitamin (MULTIVITAMIN) tablet Take 1 tablet by mouth daily.     olmesartan (BENICAR) 20 MG tablet Take 20 mg by mouth daily.     Omega-3 Fatty Acids (FISH  OIL) 1000 MG CAPS Take 2 capsules by mouth daily.     No current facility-administered medications for this visit.    Allergies:   Demerol [meperidine hcl]    Social History:  The patient  reports that he has never smoked. He has never used smokeless tobacco. He reports that he does not drink alcohol and does not use drugs.   Family History:  The patient's family history includes Alzheimer's disease in his maternal grandfather; Arthritis in his maternal grandfather and maternal grandmother; Breast cancer in his maternal grandmother; Colon cancer in his maternal grandfather; Depression in his mother; Hypertension in his maternal grandfather and maternal grandmother; Leukemia in his paternal grandmother.    ROS:  Please see the history of present illness.   Otherwise, review of systems are positive for chest tightness.   All other systems are reviewed and negative.    PHYSICAL EXAM: VS:  BP 130/70   Pulse 72   Ht '5\' 9"'$  (1.753 m)   Wt 209 lb 3.2 oz (94.9 kg)   SpO2 97%   BMI 30.89 kg/m  , BMI Body mass index is 30.89 kg/m. GEN: Well nourished, well developed, in no acute distress HEENT: normal Neck: no JVD, carotid bruits, or masses Cardiac:  RRR; no murmurs, rubs, or gallops,no edema  Respiratory:  clear to auscultation bilaterally, normal work of breathing GI: soft, nontender, nondistended, + BS MS: no deformity or atrophy Skin: warm and dry, no rash Neuro:  Strength and sensation are intact Psych: euthymic mood, full affect   EKG:   The ekg ordered today demonstrates NSR, no ST changes   Recent Labs: 05/21/2021: ALT 22; BUN 19; Creatinine, Ser 1.06; Hemoglobin 16.3; Platelets 223; Potassium 4.0; Sodium 138   Lipid Panel No results found for: CHOL, TRIG, HDL, CHOLHDL, VLDL, LDLCALC, LDLDIRECT   Other studies Reviewed: Additional studies/ records that were reviewed today with results demonstrating: labs reviewed, LDL 129 HDL 37 total cholesterol 182 triglycerides 89 A1c  5.9.   ASSESSMENT AND PLAN:  Precordial chest pain: Tightness, GERD sx.  Felt poorly.  Plan for CTA coronaries.  Metoprolol 100 mg x 1 prior to the scan.  Based on calcium score, we can also determine lipid-lowering therapy.  LDL is borderline high.  Depending on calcium score, may want to see LDL less than 100 or perhaps even closer to 70. HTN: Low salt diet.  Whole food, plant-based diet.  High fiber diet.  Home readings are in the 409-811 systolic range.  Family h/o CAD: uncle with stents at around age 59.  PreDM: Increase to 150 minutes a week of some kind exercise that increases heart rate.    Current medicines are reviewed at length with the patient today.  The patient concerns regarding his medicines were addressed.  The following changes have been made:  No change  Labs/ tests ordered today include: CTA coronaries No orders of the defined types were placed in this encounter.   Recommend 150 minutes/week of aerobic exercise Low fat, low carb, high fiber diet recommended  Disposition:   FU in based on results   Signed, Larae Grooms, MD  01/16/2022 4:16 PM    [facility] [place], [place], [place]  [address] Phone: [phone]; Fax: [phone]

## 2022-01-16 NOTE — Addendum Note (Signed)
Addended by: Jettie Booze on: 01/16/2022 04:59 PM   Modules accepted: Level of Service

## 2022-01-16 NOTE — Patient Instructions (Addendum)
Medication Instructions:  Your physician recommends that you continue on your current medications as directed. Please refer to the Current Medication list given to you today.  *If you need a refill on your cardiac medications before your next appointment, please call your pharmacy*   Lab Work: Lab work to be done prior to CT Scan--BMP If you have labs (blood work) drawn today and your tests are completely normal, you will receive your results only by: [place] (if you have MyChart) OR A paper copy in the mail If you have any lab test that is abnormal or we need to change your treatment, we will call you to review the results.   Testing/Procedures: Your physician has requested that you have cardiac CT. Cardiac computed tomography (CT) is a painless test that uses an x-ray machine to take clear, detailed pictures of your heart. For further information please visit [email or web address]. Please follow instruction sheet as given.     Follow-Up: At [facility], you and your health needs are our priority.  As part of our continuing mission to provide you with exceptional heart care, we have created designated Provider Care Teams.  These Care Teams include your primary Cardiologist (physician) and Advanced Practice Providers (APPs -  Physician Assistants and Nurse Practitioners) who all work together to provide you with the care you need, when you need it.  We recommend signing up for the patient portal called "MyChart".  Sign up information is provided on this After Visit Summary.  MyChart is used to connect with patients for Virtual Visits (Telemedicine).  Patients are able to view lab/test results, encounter notes, upcoming appointments, etc.  Non-urgent messages can be sent to your provider as well.   To learn more about what you can do with MyChart, go to [email or web address].    Your next appointment:   Based on results  The format for your next appointment:   In  Person  Provider:   Dr Irish Lack  If primary card or EP is not listed click here to update    :1}    Other Instructions   Your cardiac CT will be scheduled at one of the below locations:   [facility] [address] [place], [place] [address] [phone]  [place] [address] [place],  [address] [phone]  If scheduled at [facility], please arrive at the [facility] (Entrance C2) of [facility] 30 minutes prior to test start time. You can use the FREE valet parking offered at entrance C (encouraged to control the heart rate for the test)  Proceed to the [facility] Radiology Department (first floor) to check-in and test prep.  All radiology patients and guests should use entrance C2 at [facility], accessed from [facility], even though the hospital's physical address listed is [address].    If scheduled at [facility], please arrive 15 mins early for check-in and test prep.  Please follow these instructions carefully (unless otherwise directed):  Hold all erectile dysfunction medications at least 3 days (72 hrs) prior to test.  On the Night Before the Test: Be sure to Drink plenty of water. Do not consume any caffeinated/decaffeinated beverages or chocolate 12 hours prior to your test. Do not take any antihistamines 12 hours prior to your test.   On the Day of the Test: Drink plenty of water until 1 hour prior to the test.  Do not eat any food 4 hours prior to the test. You may take your regular medications prior to the test.  Take metoprolol (Lopressor) two hours prior to test. HOLD Furosemide/Hydrochlorothiazide morning of the test.         After the Test: Drink plenty of water. After receiving IV contrast, you may experience a mild flushed feeling. This is normal. On occasion, you may  experience a mild rash up to 24 hours after the test. This is not dangerous. If this occurs, you can take Benadryl 25 mg and increase your fluid intake. If you experience trouble breathing, this can be serious. If it is severe call 911 IMMEDIATELY. If it is mild, please call our office. If you take any of these medications: Glipizide/Metformin, Avandament, Glucavance, please do not take 48 hours after completing test unless otherwise instructed.  We will call to schedule your test 2-4 weeks out understanding that some insurance companies will need an authorization prior to the service being performed.   For non-scheduling related questions, please contact the cardiac imaging nurse navigator should you have any questions/concerns: Marchia Bond, Cardiac Imaging Nurse Navigator Gordy Clement, Cardiac Imaging Nurse Navigator [facility] Direct Office Dial: [phone]   For scheduling needs, including cancellations and rescheduling, please call [place], [phone].   Important Information About Sugar

## 2022-01-22 ENCOUNTER — Telehealth (HOSPITAL_COMMUNITY): Payer: Self-pay | Admitting: *Deleted

## 2022-01-22 NOTE — Telephone Encounter (Signed)
Reaching out to patient to offer assistance regarding upcoming cardiac imaging study; pt verbalizes understanding of appt date/time, parking situation and where to check in, pre-test NPO status and medications ordered, and verified current allergies; name and call back number provided for further questions should they arise ° °Avenly Roberge RN Navigator Cardiac Imaging °[facility] °[phone] office °[phone] cell ° °Patient to take 100mg metoprolol tartrate two hours prior to his cardiac CT scan. °

## 2022-01-23 ENCOUNTER — Ambulatory Visit
Admission: RE | Admit: 2022-01-23 | Discharge: 2022-01-23 | Disposition: A | Discharge status: Home / Self Care | Payer: Managed Care, Other (non HMO) | Source: Ambulatory Visit | Attending: Interventional Cardiology | Admitting: Interventional Cardiology

## 2022-01-23 DIAGNOSIS — R072 Precordial pain: Secondary | ICD-10-CM | POA: Insufficient documentation

## 2022-01-23 MED ORDER — IOHEXOL 350 MG/ML SOLN
100.0000 mL | Freq: Once | INTRAVENOUS | Status: AC | PRN
  Administered 2022-01-23: 100 mL via INTRAVENOUS

## 2022-01-23 MED ORDER — METOPROLOL TARTRATE 5 MG/5ML IV SOLN
10.0000 mg | Freq: Once | INTRAVENOUS | Status: AC
  Administered 2022-01-23: 10 mg via INTRAVENOUS

## 2022-01-23 MED ORDER — NITROGLYCERIN 0.4 MG SL SUBL
0.8000 mg | SUBLINGUAL_TABLET | Freq: Once | SUBLINGUAL | Status: AC
  Administered 2022-01-23: 0.8 mg via SUBLINGUAL

## 2022-01-23 NOTE — Progress Notes (Signed)
Patient tolerated procedure well. Ambulate w/o difficulty. Denies light headedness or being dizzy. Sitting in chair drinking water provided. Encouraged to drink extra water today and reasoning explained. Verbalized understanding. All questions answered. ABC intact. No further needs. Discharge from procedure area w/o issues.   °

## 2022-12-04 IMAGING — CT CT HEART MORP W/ CTA COR W/ SCORE W/ CA W/CM &/OR W/O CM
1 of 13 series · 4 of 20 positions shown, 5 images · non-contrast
Comparison: CT abdomen 05/15/2020

Addendum:
CLINICAL DATA: Chest pain

EXAM:
Cardiac/Coronary  CTA
TECHNIQUE: The patient was scanned on a Siemens Somatom go.Top scanner.

[Series 5: multiphase % cta coronary 0.60 · axial · 0.39mm/px · z∈[-1132,-1053]mm · 4 of 3936 slices shown, 5 images]
[im 788/3936  vessel]
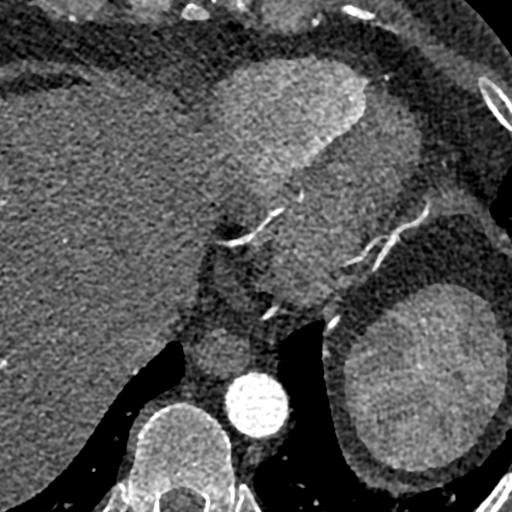
[im 788/3936  lung]
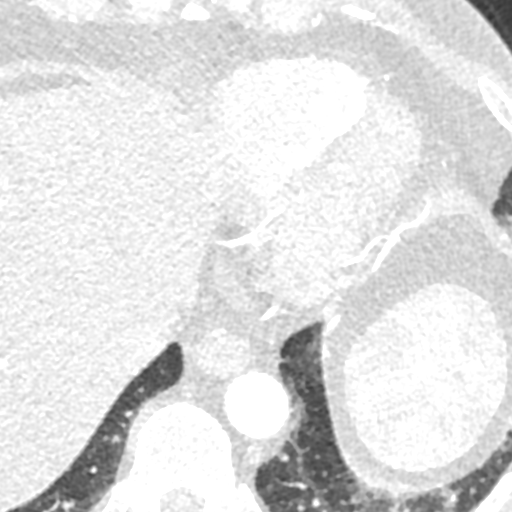
[im 1575/3936  vessel]
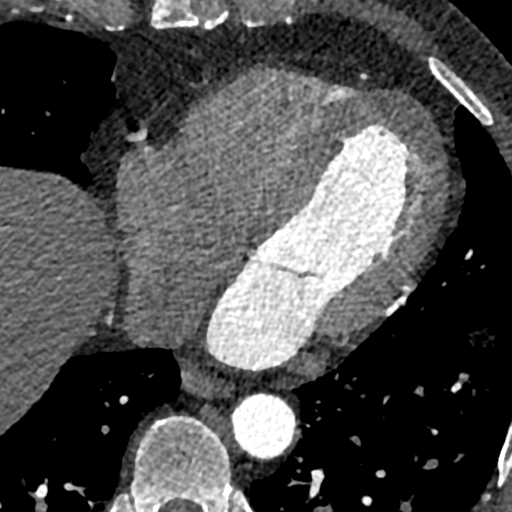
[im 2362/3936  vessel]
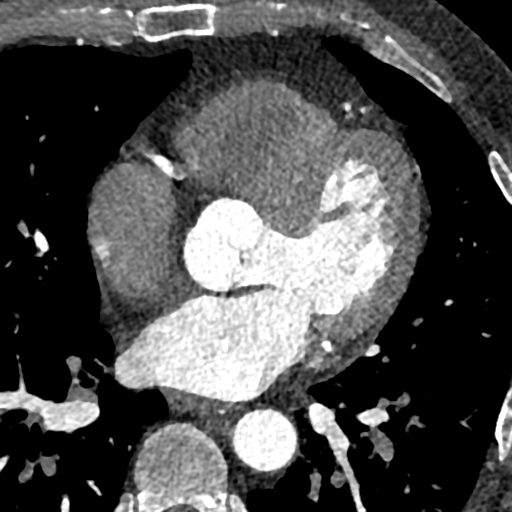
[im 3149/3936  vessel]
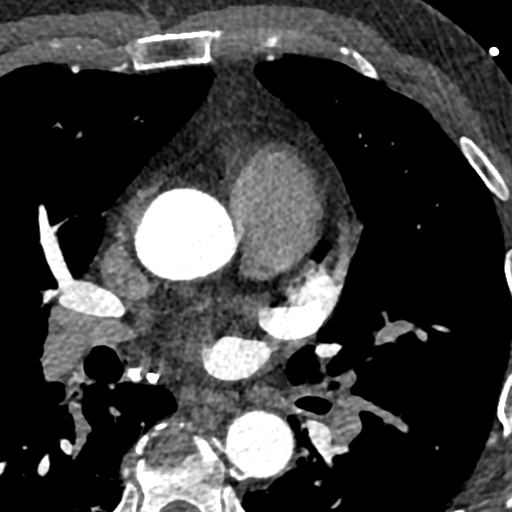

[4 of 20 positions shown; findings below may reference images not displayed]



Aortic Valve:  Trileaflet.  No calcifications.

Coronary Arteries:  Normal coronary origin.  Right dominance.

RCA is a dominant artery that gives rise to PDA and PLA. There is no
plaque.

Left main gives rise to LAD and LCX arteries. There is no LM
disease.

LAD is a large vessel that has no plaque.

LCX is a non-dominant artery that gives rise to one OM1 branch.
There is no plaque.

Other findings:

Normal pulmonary vein drainage into the left atrium.

Normal left atrial appendage without a thrombus.

Normal size of the pulmonary artery.
IMPRESSION: 1. Normal coronary calcium score of 0. Patient is low risk for
coronary events.

2. Normal coronary origin with right dominance.

3. No evidence of CAD.

4. CAD-RADS 0. No evidence of CAD (0%). Consider non-atherosclerotic
causes of chest pain.

EXAM:
OVER-READ INTERPRETATION  CT CHEST

The following report is an over-read performed by radiologist Dr.
does not include interpretation of cardiac or coronary anatomy or
pathology. The coronary CTA interpretation by the cardiologist is
attached.
FINDINGS: Calcifications in the right hilum and subcarinal are compatible with
old granulomatous disease. Again noted is a cystic structure in the
left hepatic lobe that is incompletely evaluated. No significant
lymph node enlargement in the visualized mediastinum. 5 mm nodule
along the left major fissure on sequence 14, image 5. Calcified
granuloma in the posterior right lower lobe measures 4 mm on
sequence 14 image 43. Wispy densities in the dependent aspect of
both lower lobes are suggestive for mild atelectasis. No large
pleural effusions. 3 mm nodular density in the right middle lobe on
sequence 14 image 25. No acute bone abnormality.
IMPRESSION: 1. No acute extracardiac findings.
2. Small pulmonary nodules, largest measures 5 mm. No follow-up
needed if patient is low-risk (and has no known or suspected primary
neoplasm). Non-contrast chest CT can be considered in 12 months if
patient is high-risk. This recommendation follows the consensus
statement: Guidelines for Management of Incidental Pulmonary Nodules
Detected on CT Images: From the [HOSPITAL] 2754; Radiology
2754; [DATE].
3. Evidence for old granulomatous disease.



Aortic Valve:  Trileaflet.  No calcifications.

Coronary Arteries:  Normal coronary origin.  Right dominance.

RCA is a dominant artery that gives rise to PDA and PLA. There is no
plaque.

Left main gives rise to LAD and LCX arteries. There is no LM
disease.

LAD is a large vessel that has no plaque.

LCX is a non-dominant artery that gives rise to one OM1 branch.
There is no plaque.

Other findings:

Normal pulmonary vein drainage into the left atrium.

Normal left atrial appendage without a thrombus.

Normal size of the pulmonary artery.
IMPRESSION: 1. Normal coronary calcium score of 0. Patient is low risk for
coronary events.

2. Normal coronary origin with right dominance.

3. No evidence of CAD.

4. CAD-RADS 0. No evidence of CAD (0%). Consider non-atherosclerotic
causes of chest pain.

## 2022-12-16 ENCOUNTER — Telehealth: Payer: Self-pay | Admitting: *Deleted

## 2022-12-16 NOTE — Telephone Encounter (Signed)
   Name: Jared Chen  DOB: July 02, 1966  MRN: [id number]  Primary Cardiologist: Lance Muss, MD   Preoperative team, please contact this patient and set up a phone call appointment for further preoperative risk assessment. Please obtain consent and complete medication review. Thank you for your help.  I confirm that guidance regarding antiplatelet and oral anticoagulation therapy has been completed and, if necessary, noted below.  None requested   Napoleon Form, Leodis Rains, NP 12/16/2022, 4:49 PM [facility]

## 2022-12-16 NOTE — Telephone Encounter (Signed)
Patient was calling to talk with pre-op

## 2022-12-16 NOTE — Telephone Encounter (Signed)
Left message for the patient to contact the office. 

## 2022-12-16 NOTE — Telephone Encounter (Signed)
   Pre-operative Risk Assessment    Patient Name: Jared Chen  DOB: 1965-12-05 MRN: [id number]      Request for Surgical Clearance    Procedure:   LEFT SHOULDER ARTHROSCOPY   Date of Surgery:  Clearance 02/05/23                                 Surgeon:  DR. Theron Arista DALLDORF Surgeon's Group or Practice Name:  [facility] Phone number:  [phone] ATTN: REBECCA LANG Fax number:  [phone]   Type of Clearance Requested:   - Medical ; NO MEDICATIONS LISTED AS NEEDING TO BE HELD   Type of Anesthesia:  General    Additional requests/questions:    Elpidio Anis   12/16/2022, 4:43 PM

## 2022-12-17 ENCOUNTER — Telehealth: Payer: Self-pay

## 2022-12-17 NOTE — Telephone Encounter (Signed)
Spoke with the patient and offered telehealth appt. Patient agreeable and voiced understanding.   Med list reviewed and consent given.

## 2022-12-17 NOTE — Telephone Encounter (Signed)
[name], [name]15 hours ago (4:58 PM)   PP Patient was calling to talk with pre-op       Note   Jared Chen, Jared Chen [phone]  Brayton El

## 2022-12-17 NOTE — Telephone Encounter (Signed)
Left message x 2 to call back to schedule a tele pre op appt  

## 2022-12-17 NOTE — Telephone Encounter (Signed)
  Patient Consent for Virtual Visit         Jared Chen has provided verbal consent on 12/17/2022 for a virtual visit (video or telephone).   CONSENT FOR VIRTUAL VISIT FOR:  Jared Chen  By participating in this virtual visit I agree to the following:  I hereby voluntarily request, consent and authorize [facility] and its employed or contracted physicians, physician assistants, nurse practitioners or other licensed health care professionals (the Practitioner), to provide me with telemedicine health care services (the "Services") as deemed necessary by the treating Practitioner. I acknowledge and consent to receive the Services by the Practitioner via telemedicine. I understand that the telemedicine visit will involve communicating with the Practitioner through live audiovisual communication technology and the disclosure of certain medical information by electronic transmission. I acknowledge that I have been given the opportunity to request an in-person assessment or other available alternative prior to the telemedicine visit and am voluntarily participating in the telemedicine visit.  I understand that I have the right to withhold or withdraw my consent to the use of telemedicine in the course of my care at any time, without affecting my right to future care or treatment, and that the Practitioner or I may terminate the telemedicine visit at any time. I understand that I have the right to inspect all information obtained and/or recorded in the course of the telemedicine visit and may receive copies of available information for a reasonable fee.  I understand that some of the potential risks of receiving the Services via telemedicine include:  Delay or interruption in medical evaluation due to technological equipment failure or disruption; Information transmitted may not be sufficient (e.g. poor resolution of images) to allow for appropriate medical decision making by the Practitioner;  and/or  In rare instances, security protocols could fail, causing a breach of personal health information.  Furthermore, I acknowledge that it is my responsibility to provide information about my medical history, conditions and care that is complete and accurate to the best of my ability. I acknowledge that Practitioner's advice, recommendations, and/or decision may be based on factors not within their control, such as incomplete or inaccurate data provided by me or distortions of diagnostic images or specimens that may result from electronic transmissions. I understand that the practice of medicine is not an exact science and that Practitioner makes no warranties or guarantees regarding treatment outcomes. I acknowledge that a copy of this consent can be made available to me via my patient portal [facility] MyChart), or I can request a printed copy by calling the office of [facility].    I understand that my insurance will be billed for this visit.   I have read or had this consent read to me. I understand the contents of this consent, which adequately explains the benefits and risks of the Services being provided via telemedicine.  I have been provided ample opportunity to ask questions regarding this consent and the Services and have had my questions answered to my satisfaction. I give my informed consent for the services to be provided through the use of telemedicine in my medical care

## 2023-01-05 ENCOUNTER — Ambulatory Visit: Payer: Managed Care, Other (non HMO) | Attending: Cardiovascular Disease | Admitting: Nurse Practitioner

## 2023-01-05 DIAGNOSIS — Z01810 Encounter for preprocedural cardiovascular examination: Secondary | ICD-10-CM | POA: Diagnosis not present

## 2023-01-05 NOTE — Progress Notes (Signed)
Virtual Visit via Telephone Note   Because of Jared Chen's co-morbid illnesses, he is at least at moderate risk for complications without adequate follow up.  This format is felt to be most appropriate for this patient at this time.  The patient did not have access to video technology/had technical difficulties with video requiring transitioning to audio format only (telephone).  All issues noted in this document were discussed and addressed.  No physical exam could be performed with this format.  Please refer to the patient's chart for his consent to telehealth for Jared Chen.  Evaluation Performed:  Preoperative cardiovascular risk assessment _____________   Date:  01/05/2023   Patient ID:  Jared Chen, DOB April 05, 1966, MRN [id number] Patient Location:  Home Provider location:   Office  Primary Care Provider:  Geoffry Paradise, MD Primary Cardiologist:  Jared Muss, MD  Chief Complaint / Patient Profile   57 y.o. y/o male with a h/o precordial pain, hypertension, and prediabetes who is pending left shoulder arthroscopy on 02/05/2023 with Dr. Marcene [facility] and presents today for telephonic preoperative cardiovascular risk assessment.  History of Present Illness    Jared Chen is a 57 y.o. male who presents via audio/video conferencing for a telehealth visit today.  Pt was last seen in cardiology clinic on 01/16/2022 by Dr. Eldridge Chen.  At that time Jared Chen was doing well.  Coronary CT angiogram in the setting of atypical chest pain showed coronary calcium score of 0, no evidence of CAD.  The patient is now pending procedure as outlined above. Since his last visit, he has done well from a cardiac standpoint.   He denies chest pain, palpitations, dyspnea, pnd, orthopnea, n, v, dizziness, syncope, edema, weight gain, or early satiety. All other systems reviewed and are otherwise negative except as noted above.   Past Medical History    Past  Medical History:  Diagnosis Date   Adenomatous colon polyp 2009   Chest pain    History of kidney stones    Insomnia    Radial nerve injury    Past Surgical History:  Procedure Laterality Date   EXTRACORPOREAL SHOCK WAVE LITHOTRIPSY N/A 08/19/2018   Procedure: EXTRACORPOREAL SHOCK WAVE LITHOTRIPSY (ESWL);  Surgeon: Malen Gauze, MD;  Location: [facility];  Service: Urology;  Laterality: N/A;   LITHOTRIPSY     multiple   NASAL SEPTUM SURGERY  2001   URETER SURGERY  1985   had collasped lung with surgery   URETEROSCOPY WITH HOLMIUM LASER LITHOTRIPSY     x2,   VASECTOMY  2002- or 2003    Allergies  Allergies  Allergen Reactions   Demerol [Meperidine Hcl] Other (See Comments)    Explosive headache, vomiting    Home Medications    Prior to Admission medications   Medication Sig Start Date End Date Taking? Authorizing Provider  acetaminophen (TYLENOL) 325 MG tablet as needed.    [provider]  celecoxib (CELEBREX) 200 MG capsule as needed. 05/29/15   [provider]  Multiple Vitamin (MULTIVITAMIN) tablet Take 1 tablet by mouth daily.    [provider]  olmesartan (BENICAR) 20 MG tablet Take 20 mg by mouth daily.    [provider]  Omega-3 Fatty Acids (FISH OIL) 1000 MG CAPS Take 2 capsules by mouth daily.    [provider]    Physical Exam    Vital Signs:  Jared Chen does not have vital signs available for  review today.  Given telephonic nature of communication, physical exam is limited. AAOx3. NAD. Normal affect.  Speech and respirations are unlabored.  Accessory Clinical Findings    None  Assessment & Plan    1.  Preoperative Cardiovascular Risk Assessment:  According to the Revised Cardiac Risk Index (RCRI), his Perioperative Risk of Major Cardiac Event is (%): 0.4. His Functional Capacity in METs is: 9.89 according to the Duke Activity Status Index (DASI). Therefore, based on ACC/AHA guidelines, patient would  be at acceptable risk for the planned procedure without further cardiovascular testing. The patient was advised that if he develops new symptoms prior to surgery to contact our office to arrange for a follow-up visit, and he verbalized understanding.  A copy of this note will be routed to requesting surgeon.  Time:   Today, I have spent 5 minutes with the patient with telehealth technology discussing medical history, symptoms, and management plan.     Jared Grapes, NP  01/05/2023, 3:13 PM

## 2023-01-14 ENCOUNTER — Encounter: Payer: Self-pay | Admitting: Gastroenterology

## 2024-04-19 ENCOUNTER — Ambulatory Visit: Admitting: Gastroenterology

## 2024-04-19 ENCOUNTER — Telehealth: Payer: Self-pay

## 2024-04-19 ENCOUNTER — Encounter: Payer: Self-pay | Admitting: Gastroenterology

## 2024-04-19 VITALS — BP 130/80 | HR 79 | Ht 69.0 in | Wt 208.0 lb

## 2024-04-19 DIAGNOSIS — Z800 Family history of malignant neoplasm of digestive organs: Secondary | ICD-10-CM

## 2024-04-19 DIAGNOSIS — K219 Gastro-esophageal reflux disease without esophagitis: Secondary | ICD-10-CM

## 2024-04-19 DIAGNOSIS — Z860100 Personal history of colon polyps, unspecified: Secondary | ICD-10-CM

## 2024-04-19 MED ORDER — NA SULFATE-K SULFATE-MG SULF 17.5-3.13-1.6 GM/177ML PO SOLN: 1.0000 | Freq: Once | ORAL | 0 refills | Status: AC

## 2024-04-19 MED ORDER — FAMOTIDINE 20 MG PO TABS: 20.0000 mg | ORAL_TABLET | Freq: Two times a day (BID) | ORAL | 3 refills | Status: AC | PRN

## 2024-04-19 NOTE — Progress Notes (Signed)
 Discussed the use of AI scribe software for clinical note transcription with the patient, who gave verbal consent to proceed.  HPI : Jared Chen is a 58 year old male with a history of colon polyps, family history of colon cancer and GERD who presents with worsening GERD symptoms and to schedule surveillance colonoscopy.  He experiences fullness and pressure in the chest, occurring two to three times a week, primarily after meals. The symptoms are not associated with a burning sensation or acid taste in the mouth. The pressure does not occur during swallowing but rather as an after-effect of eating, particularly with certain foods like liquid cheeses in [place] dishes. The symptoms have been present for about a year and seem to be worsening.  He has not been taking any regular medications for these symptoms but has used Prilosec and Tums occasionally. He had an upper endoscopy in 2019, during which he was told he had a small hiatal hernia and a non-obstructive Schatzki ring, but no esophageal damage or precancerous changes were found.  There is a family history of colon cancer; his maternal grandfather was diagnosed at around 48 years old. His mother and uncle have had numerous polyps, though no cancer. He had two adenomas removed during a colonoscopy in 2019 and has undergone multiple colonoscopies, with polyps found each time.  He was recommended to repeat colonoscopy in 5 years.      CT AP 05/15/2020 negative HIDA 05/07/2020 EF=84%  RUQ US  04/01/2020 negative   EGD July 2019  Nonobstructive Schatkzi ring, small HH, gastritis Biopsies negative for H pylori and EoE Colonoscopy July 2019 2 tubular adenomas  Past Medical History:  Diagnosis Date   Adenomatous colon polyp 2009   Chest pain    History of kidney stones    Insomnia    Radial nerve injury      Past Surgical History:  Procedure Laterality Date   EXTRACORPOREAL SHOCK WAVE LITHOTRIPSY N/A 08/19/2018   Procedure: EXTRACORPOREAL  SHOCK WAVE LITHOTRIPSY (ESWL);  Surgeon: Sherrilee Belvie CROME, MD;  Location: [facility];  Service: Urology;  Laterality: N/A;   LITHOTRIPSY     multiple   NASAL SEPTUM SURGERY  2001   URETER SURGERY  1985   had collasped lung with surgery   URETEROSCOPY WITH HOLMIUM LASER LITHOTRIPSY     x2,   VASECTOMY  2002- or 2003   Family History  Problem Relation Age of Onset   Depression Mother    Breast cancer Maternal Grandmother    Arthritis Maternal Grandmother    Hypertension Maternal Grandmother    Colon cancer Maternal Grandfather    Arthritis Maternal Grandfather    Hypertension Maternal Grandfather    Alzheimer's disease Maternal Grandfather    Leukemia Paternal Grandmother    Social History   Tobacco Use   Smoking status: Never   Smokeless tobacco: Never  Vaping Use   Vaping status: Never Used  Substance Use Topics   Alcohol use: Never   Drug use: Never   Current Outpatient Medications  Medication Sig Dispense Refill   acetaminophen (TYLENOL) 325 MG tablet as needed.     celecoxib (CELEBREX) 200 MG capsule as needed.     Multiple Vitamin (MULTIVITAMIN) tablet Take 1 tablet by mouth daily.     olmesartan (BENICAR) 20 MG tablet Take 20 mg by mouth daily.     Omega-3 Fatty Acids (FISH OIL) 1000 MG CAPS Take 2 capsules by mouth daily.     No current facility-administered medications  for this visit.   Allergies  Allergen Reactions   Demerol [Meperidine Hcl] Other (See Comments)    Explosive headache, vomiting     Review of Systems: All systems reviewed and negative except where noted in HPI.    No results found.  Physical Exam: BP 130/80   Pulse 79   Ht 5' 9 (1.753 m)   Wt 208 lb (94.3 kg)   BMI 30.72 kg/m  Constitutional: Pleasant,well-developed, Caucasian male in no acute distress. HEENT: Normocephalic and atraumatic. Conjunctivae are normal. No scleral icterus. Neck supple.  Cardiovascular: Normal rate, regular rhythm.  Pulmonary/chest: Effort normal  and breath sounds normal. No wheezing, rales or rhonchi. Abdominal: Soft, nondistended, nontender. Bowel sounds active throughout. There are no masses palpable. No hepatomegaly. Extremities: no edema Neurological: Alert and oriented to person place and time. Skin: Skin is warm and dry. No rashes noted. Psychiatric: Normal mood and affect. Behavior is normal.  CBC    Component Value Date/Time   WBC 6.7 05/21/2021 0812   RBC 5.45 05/21/2021 0812   HGB 16.3 05/21/2021 0812   HCT 49.2 05/21/2021 0812   PLT 223 05/21/2021 0812   MCV 90.3 05/21/2021 0812   MCH 29.9 05/21/2021 0812   MCHC 33.1 05/21/2021 0812   RDW 13.3 05/21/2021 0812    CMP     Component Value Date/Time   NA 138 05/21/2021 0812   K 4.0 05/21/2021 0812   CL 102 05/21/2021 0812   CO2 26 05/21/2021 0812   GLUCOSE 110 (H) 05/21/2021 0812   BUN 19 05/21/2021 0812   CREATININE 1.06 05/21/2021 0812   CALCIUM 9.7 05/21/2021 0812   PROT 7.9 05/21/2021 0812   PROT 6.8 07/17/2020 1339   ALBUMIN 4.6 05/21/2021 0812   ALBUMIN 4.8 07/17/2020 1339   AST 18 05/21/2021 0812   ALT 22 05/21/2021 0812   ALKPHOS 111 05/21/2021 0812   BILITOT 0.7 05/21/2021 0812   BILITOT 0.6 07/17/2020 1339   GFRNONAA >60 05/21/2021 0812       Latest Ref Rng & Units 05/21/2021    8:12 AM  CBC EXTENDED  WBC 4.0 - 10.5 K/uL 6.7   RBC 4.22 - 5.81 MIL/uL 5.45   Hemoglobin 13.0 - 17.0 g/dL 83.6   HCT 60.9 - 47.9 % 49.2   Platelets 150 - 400 K/uL 223       ASSESSMENT AND PLAN:  58 year old male with history of colon polyps as well as significant family history of colon cancer and colon polyps (maternal grandfather with colon cancer, uncle and mother with numerous polyps) due for surveillance colonoscopy.  He also has reflux symptoms characterized by fullness and pressure in the chest after meals, but currently not taking anything for reflux.  An upper endoscopy in 2019 showed a small hiatal hernia and nonobstructive Schatzki ring, but no  reflux esophagitis  Gastroesophageal reflux disease without esophagitis with small hiatal hernia Chronic reflux symptoms likely exacerbated by hiatal hernia. Previous endoscopy showed small hiatal hernia and non-obstructive Schatzki ring without esophageal damage.  No indication for repeat upper endoscopy at this time. - Recommend Pepcid  20 mg OTC for as-needed or daily use. -Consider repeat EGD if symptoms not responding to acid suppression.  Personal history of colonic polyps with family history of colon cancer Family history of colon cancer and personal history of polyps necessitate surveillance. Most recent colonoscopy in 2019 revealed two adenomas. 5-year interval for colonoscopy is reasonable. - Schedule colonoscopy  Recording duration: 13 minutes  The details, risks (including bleeding, perforation, infection, missed lesions, medication reactions and possible hospitalization or surgery if complications occur), benefits, and alternatives to colonoscopy with possible biopsy and possible polypectomy were discussed with the patient and he consents to proceed.    Jared Mccarey E. Stacia, MD [facility]    Shepard Ade, MD

## 2024-04-19 NOTE — Patient Instructions (Addendum)
 We have sent the following medications to your pharmacy for you to pick up at your convenience: famotidine  20 mg.   It has been recommended to you by your physician that you have a(n) colonoscopy completed. Per your request, we did not schedule the procedure(s) today. Please contact our office at [phone] should you decide to have the procedure completed. You will be scheduled for a pre-visit and procedure at that time.  _______________________________________________________  If your blood pressure at your visit was 140/90 or greater, please contact your primary care physician to follow up on this.  _______________________________________________________  If you are age 58 or older, your body mass index should be between 23-30. Your Body mass index is 30.72 kg/m. If this is out of the aforementioned range listed, please consider follow up with your Primary Care Provider.  If you are age 62 or younger, your body mass index should be between 19-25. Your Body mass index is 30.72 kg/m. If this is out of the aformentioned range listed, please consider follow up with your Primary Care Provider.   ________________________________________________________  The [facility] providers would like to encourage you to use MYCHART to communicate with providers for non-urgent requests or questions.  Due to long hold times on the telephone, sending your provider a message by [facility] may be a faster and more efficient way to get a response.  Please allow 48 business hours for a response.  Please remember that this is for non-urgent requests.  _______________________________________________________  [facility] is using a team-based approach to care.  Your team is made up of your doctor and two to three APPS. Our APPS (Nurse Practitioners and Physician Assistants) work with your physician to ensure care continuity for you. They are fully qualified to address your health concerns and develop a  treatment plan. They communicate directly with your gastroenterologist to care for you. Seeing the Advanced Practice Practitioners on your physician's team can help you by facilitating care more promptly, often allowing for earlier appointments, access to diagnostic testing, procedures, and other specialty referrals.

## 2024-04-19 NOTE — Telephone Encounter (Signed)
 Patient had called earlier to schedule his colonoscopy but hung up before I could speak with him. Left message for patient to call me back.

## 2024-04-19 NOTE — Telephone Encounter (Signed)
 Patient returned my call and would like to schedule his colonoscopy tomorrow 04/20/24 at 7:30 am. Patient states he was not sure if he could schedule it that quickly. Informed patient to remain on a clear liquid diet the rest of the day and push plenty of liquids. Also, informed patient that I will send his instructions through MyChart. Patient states she would prefer I email the instructions. Instructions emailed to [email or web address]. Informed patient to review all the instructions and to contact me with any questions. Patient verbalized understanding. Suprep sent to patient's pharmacy.

## 2024-04-19 NOTE — Addendum Note (Signed)
 Addended by: MADAN, Kilee Hedding L on: 04/19/2024 01:28 PM   Modules accepted: Orders

## 2024-04-19 NOTE — Telephone Encounter (Signed)
 Inbound call from patient requesting to speak to [place]. Patient is requesting a call back. Please advise.

## 2024-04-20 ENCOUNTER — Ambulatory Visit: Admitting: Gastroenterology

## 2024-04-20 ENCOUNTER — Encounter: Payer: Self-pay | Admitting: Gastroenterology

## 2024-04-20 VITALS — BP 121/68 | HR 70 | Temp 97.9°F | Resp 15 | Ht 69.0 in | Wt 208.0 lb

## 2024-04-20 DIAGNOSIS — Z1211 Encounter for screening for malignant neoplasm of colon: Secondary | ICD-10-CM | POA: Diagnosis present

## 2024-04-20 DIAGNOSIS — K5730 Diverticulosis of large intestine without perforation or abscess without bleeding: Secondary | ICD-10-CM | POA: Diagnosis not present

## 2024-04-20 DIAGNOSIS — D122 Benign neoplasm of ascending colon: Secondary | ICD-10-CM

## 2024-04-20 DIAGNOSIS — D123 Benign neoplasm of transverse colon: Secondary | ICD-10-CM

## 2024-04-20 DIAGNOSIS — Z860100 Personal history of colon polyps, unspecified: Secondary | ICD-10-CM | POA: Diagnosis not present

## 2024-04-20 MED ORDER — SODIUM CHLORIDE 0.9 % IV SOLN: 500.0000 mL | INTRAVENOUS | Status: DC

## 2024-04-20 NOTE — Op Note (Signed)
 [facility] Patient Name: Jared Chen Procedure Date: 04/20/2024 8:41 AM MRN: [id number] Endoscopist: Glendia E. Stacia , MD, [id number] Age: 58 Referring MD:  Date of Birth: 12/11/1965 Gender: Male Account #: [id number] Procedure:                Colonoscopy Indications:              High risk colon cancer surveillance: Personal                            history of non-advanced adenoma Medicines:                Monitored Anesthesia Care Procedure:                Pre-Anesthesia Assessment:                           - Prior to the procedure, a History and Physical                            was performed, and patient medications and                            allergies were reviewed. The patient's tolerance of                            previous anesthesia was also reviewed. The risks                            and benefits of the procedure and the sedation                            options and risks were discussed with the patient.                            All questions were answered, and informed consent                            was obtained. Prior Anticoagulants: The patient has                            taken no anticoagulant or antiplatelet agents. ASA                            Grade Assessment: II - A patient with mild systemic                            disease. After reviewing the risks and benefits,                            the patient was deemed in satisfactory condition to                            undergo the procedure.  After obtaining informed consent, the colonoscope                            was passed under direct vision. Throughout the                            procedure, the patient's blood pressure, pulse, and                            oxygen saturations were monitored continuously. The                            CF [id number] #[id number] was introduced through the anus                            and advanced to the the  terminal ileum, with                            identification of the appendiceal orifice and IC                            valve. The colonoscopy was performed without                            difficulty. The patient tolerated the procedure                            well. The quality of the bowel preparation was                            good. The terminal ileum, ileocecal valve,                            appendiceal orifice, and rectum were photographed.                            The bowel preparation used was SUPREP via split                            dose instruction. Scope In: 8:46:36 AM Scope Out: 9:10:10 AM Scope Withdrawal Time: 0 hours 20 minutes 33 seconds  Total Procedure Duration: 0 hours 23 minutes 34 seconds  Findings:                 The perianal and digital rectal examinations were                            normal. Pertinent negatives include normal                            sphincter tone and no palpable rectal lesions.                           Four sessile polyps were found in the ascending  colon. The polyps were 3 to 4 mm in size. These                            polyps were removed with a cold snare. Resection                            and retrieval were complete. Estimated blood loss                            was minimal.                           Five sessile polyps were found in the transverse                            colon. The polyps were 3 to 5 mm in size. These                            polyps were removed with a cold snare. Resection                            and retrieval were complete. Estimated blood loss                            was minimal.                           Multiple medium-mouthed and small-mouthed                            diverticula were found in the sigmoid colon,                            descending colon, transverse colon and ascending                            colon.                            The exam was otherwise normal throughout the                            examined colon.                           The terminal ileum appeared normal.                           The retroflexed view of the distal rectum and anal                            verge was normal and showed no anal or rectal                            abnormalities. Complications:  No immediate complications. Estimated Blood Loss:     Estimated blood loss was minimal. Impression:               - Four 3 to 4 mm polyps in the ascending colon,                            removed with a cold snare. Resected and retrieved.                           - Five 3 to 5 mm polyps in the transverse colon,                            removed with a cold snare. Resected and retrieved.                           - Moderate diverticulosis in the sigmoid colon, in                            the descending colon, in the transverse colon and                            in the ascending colon.                           - The examined portion of the ileum was normal.                           - The distal rectum and anal verge are normal on                            retroflexion view. Recommendation:           - Patient has a contact number available for                            emergencies. The signs and symptoms of potential                            delayed complications were discussed with the                            patient. Return to normal activities tomorrow.                            Written discharge instructions were provided to the                            patient.                           - Resume previous diet.                           - Continue present medications.                           -  Await pathology results.                           - Repeat colonoscopy in 3 years for surveillance. Fany Cavanaugh E. Stacia, MD 04/20/2024 9:19:06 AM This report has been signed electronically.

## 2024-04-20 NOTE — Progress Notes (Signed)
 History and Physical Interval Note:  04/20/2024 8:35 AM  Jared Chen  has presented today for endoscopic procedure(s), with the diagnosis of  Encounter Diagnosis  Name Primary?   History of colonic polyps Yes  .  The various methods of evaluation and treatment have been discussed with the patient and/or family. After consideration of risks, benefits and other options for treatment, the patient has consented to  the endoscopic procedure(s).   The patient's history has been reviewed, patient examined, no change in status, stable for endoscopic procedure(s).  I have reviewed the patient's chart and labs.  Questions were answered to the patient's satisfaction.     Shanasia Ibrahim E. Stacia, MD [facility]

## 2024-04-20 NOTE — Progress Notes (Signed)
 Vss nad trans to pacu

## 2024-04-20 NOTE — Patient Instructions (Addendum)
-  Handout on polyps and diverticulosis provided -Await pathology results  YOU HAD AN ENDOSCOPIC PROCEDURE TODAY AT THE [facility]:   Refer to the procedure report that was given to you for any specific questions about what was found during the examination.  If the procedure report does not answer your questions, please call your gastroenterologist to clarify.  If you requested that your care partner not be given the details of your procedure findings, then the procedure report has been included in a sealed envelope for you to review at your convenience later.  YOU SHOULD EXPECT: Some feelings of bloating in the abdomen. Passage of more gas than usual.  Walking can help get rid of the air that was put into your GI tract during the procedure and reduce the bloating. If you had a lower endoscopy (such as a colonoscopy or flexible sigmoidoscopy) you may notice spotting of blood in your stool or on the toilet paper. If you underwent a bowel prep for your procedure, you may not have a normal bowel movement for a few days.  Please Note:  You might notice some irritation and congestion in your nose or some drainage.  This is from the oxygen used during your procedure.  There is no need for concern and it should clear up in a day or so.  SYMPTOMS TO REPORT IMMEDIATELY:  Following lower endoscopy (colonoscopy or flexible sigmoidoscopy):  Excessive amounts of blood in the stool  Significant tenderness or worsening of abdominal pains  Swelling of the abdomen that is new, acute  Fever of 100F or higher  For urgent or emergent issues, a gastroenterologist can be reached at any hour by calling [phone]. Do not use MyChart messaging for urgent concerns.    DIET:  We do recommend a small meal at first, but then you may proceed to your regular diet.  Drink plenty of fluids but you should avoid alcoholic beverages for 24 hours.  ACTIVITY:  You should plan to take it easy for the rest of today  and you should NOT DRIVE or use heavy machinery until tomorrow (because of the sedation medicines used during the test).    FOLLOW UP: Our staff will call the number listed on your records the next business day following your procedure.  We will call around 7:15- 8:00 am to check on you and address any questions or concerns that you may have regarding the information given to you following your procedure. If we do not reach you, we will leave a message.     If any biopsies were taken you will be contacted by phone or by letter within the next 1-3 weeks.  Please call us  at [phone] if you have not heard about the biopsies in 3 weeks.    SIGNATURES/CONFIDENTIALITY: You and/or your care partner have signed paperwork which will be entered into your electronic medical record.  These signatures attest to the fact that that the information above on your After Visit Summary has been reviewed and is understood.  Full responsibility of the confidentiality of this discharge information lies with you and/or your care-partner.

## 2024-04-20 NOTE — Progress Notes (Signed)
 Called to room to assist during endoscopic procedure.  Patient ID and intended procedure confirmed with present staff. Received instructions for my participation in the procedure from the performing physician.

## 2024-04-21 ENCOUNTER — Telehealth: Payer: Self-pay | Admitting: *Deleted

## 2024-04-21 NOTE — Telephone Encounter (Signed)
 No answer post call. Left a message.

## 2024-04-22 LAB — SURGICAL PATHOLOGY

## 2024-04-23 ENCOUNTER — Ambulatory Visit: Payer: Self-pay | Admitting: Gastroenterology

## 2024-04-23 NOTE — Progress Notes (Signed)
 Jared Chen,   All 9 polyps that I removed during your recent procedure were completely benign but were proven to be pre-cancerous polyps that MAY have grown into cancers if they had not been removed.  Studies shows that at least 20% of women over age 58 and 30% of men over age 59 have pre-cancerous polyps.  Based on current nationally recognized surveillance guidelines, I recommend that you have a repeat colonoscopy in 3 years.   If you develop any new rectal bleeding, abdominal pain or significant bowel habit changes, please contact me before then.

## 2024-07-01 ENCOUNTER — Ambulatory Visit (HOSPITAL_BASED_OUTPATIENT_CLINIC_OR_DEPARTMENT_OTHER)

## 2024-07-01 ENCOUNTER — Encounter (HOSPITAL_BASED_OUTPATIENT_CLINIC_OR_DEPARTMENT_OTHER): Payer: Self-pay

## 2024-07-01 ENCOUNTER — Other Ambulatory Visit (HOSPITAL_BASED_OUTPATIENT_CLINIC_OR_DEPARTMENT_OTHER): Payer: Self-pay | Admitting: Adult Health

## 2024-07-01 DIAGNOSIS — R10A1 Flank pain, right side: Secondary | ICD-10-CM

## 2024-07-01 DIAGNOSIS — R319 Hematuria, unspecified: Secondary | ICD-10-CM

## 2024-07-01 DIAGNOSIS — Z87442 Personal history of urinary calculi: Secondary | ICD-10-CM

## 2024-07-02 ENCOUNTER — Emergency Department (HOSPITAL_BASED_OUTPATIENT_CLINIC_OR_DEPARTMENT_OTHER)
Admission: EM | Admit: 2024-07-02 | Discharge: 2024-07-02 | Disposition: A | Discharge status: Home / Self Care | Attending: Emergency Medicine | Admitting: Emergency Medicine

## 2024-07-02 ENCOUNTER — Other Ambulatory Visit: Payer: Self-pay

## 2024-07-02 ENCOUNTER — Encounter (HOSPITAL_BASED_OUTPATIENT_CLINIC_OR_DEPARTMENT_OTHER): Payer: Self-pay

## 2024-07-02 ENCOUNTER — Emergency Department (HOSPITAL_BASED_OUTPATIENT_CLINIC_OR_DEPARTMENT_OTHER)

## 2024-07-02 DIAGNOSIS — E871 Hypo-osmolality and hyponatremia: Secondary | ICD-10-CM | POA: Insufficient documentation

## 2024-07-02 DIAGNOSIS — I10 Essential (primary) hypertension: Secondary | ICD-10-CM | POA: Insufficient documentation

## 2024-07-02 DIAGNOSIS — N12 Tubulo-interstitial nephritis, not specified as acute or chronic: Secondary | ICD-10-CM | POA: Insufficient documentation

## 2024-07-02 DIAGNOSIS — R10A1 Flank pain, right side: Secondary | ICD-10-CM | POA: Diagnosis present

## 2024-07-02 LAB — COMPREHENSIVE METABOLIC PANEL WITH GFR
ALT: 26 U/L (ref 0–44)
AST: 22 U/L (ref 15–41)
Albumin: 4.4 g/dL (ref 3.5–5.0)
Alkaline Phosphatase: 124 U/L (ref 38–126)
Anion gap: 15 (ref 5–15)
BUN: 23 mg/dL — ABNORMAL HIGH (ref 6–20)
CO2: 20 mmol/L — ABNORMAL LOW (ref 22–32)
Calcium: 9.4 mg/dL (ref 8.9–10.3)
Chloride: 99 mmol/L (ref 98–111)
Creatinine, Ser: 1.16 mg/dL (ref 0.61–1.24)
GFR, Estimated: >60 mL/min (ref >60)
Glucose, Bld: 144 mg/dL — ABNORMAL HIGH (ref 70–99)
Potassium: 3.9 mmol/L (ref 3.5–5.1)
Sodium: 133 mmol/L — ABNORMAL LOW (ref 135–145)
Total Bilirubin: 0.8 mg/dL (ref 0.0–1.2)
Total Protein: 7.3 g/dL (ref 6.5–8.1)

## 2024-07-02 LAB — CBC WITH DIFFERENTIAL/PLATELET
Abs Immature Granulocytes: 0.02 K/uL (ref 0.00–0.07)
Basophils Absolute: 0 K/uL (ref 0.0–0.1)
Basophils Relative: 0 %
Eosinophils Absolute: 0 K/uL (ref 0.0–0.5)
Eosinophils Relative: 0 %
HCT: 43.8 % (ref 39.0–52.0)
Hemoglobin: 15.2 g/dL (ref 13.0–17.0)
Immature Granulocytes: 0 %
Lymphocytes Relative: 6 %
Lymphs Abs: 0.5 K/uL — ABNORMAL LOW (ref 0.7–4.0)
MCH: 31.1 pg (ref 26.0–34.0)
MCHC: 34.7 g/dL (ref 30.0–36.0)
MCV: 89.6 fL (ref 80.0–100.0)
Monocytes Absolute: 0.6 K/uL (ref 0.1–1.0)
Monocytes Relative: 7 %
Neutro Abs: 7.1 K/uL (ref 1.7–7.7)
Neutrophils Relative %: 87 %
Platelets: 142 K/uL — ABNORMAL LOW (ref 150–400)
RBC: 4.89 MIL/uL (ref 4.22–5.81)
RDW: 13.3 % (ref 11.5–15.5)
WBC: 8.2 K/uL (ref 4.0–10.5)
nRBC: 0 % (ref 0.0–0.2)

## 2024-07-02 LAB — URINALYSIS, ROUTINE W REFLEX MICROSCOPIC
Bacteria, UA: NONE SEEN
Bilirubin Urine: NEGATIVE
Glucose, UA: NEGATIVE mg/dL
Leukocytes,Ua: NEGATIVE
Nitrite: NEGATIVE
Protein, ur: 30 mg/dL — AB
Specific Gravity, Urine: 1.024 (ref 1.005–1.030)
pH: 7 (ref 5.0–8.0)

## 2024-07-02 MED ORDER — ONDANSETRON HCL 4 MG/2ML IJ SOLN
4.0000 mg | Freq: Once | INTRAMUSCULAR | Status: AC
  Administered 2024-07-02: 4 mg via INTRAVENOUS
  Filled 2024-07-02: qty 2

## 2024-07-02 MED ORDER — KETOROLAC TROMETHAMINE 15 MG/ML IJ SOLN
15.0000 mg | Freq: Once | INTRAMUSCULAR | Status: AC
  Administered 2024-07-02: 15 mg via INTRAVENOUS
  Filled 2024-07-02: qty 1

## 2024-07-02 MED ORDER — SODIUM CHLORIDE 0.9 % IV SOLN
2.0000 g | Freq: Once | INTRAVENOUS | Status: AC
  Administered 2024-07-02: 2 g via INTRAVENOUS
  Filled 2024-07-02: qty 20

## 2024-07-02 NOTE — ED Triage Notes (Signed)
 Pt states that he began to have R sided flank pain on Thursday, went to see PCP, started on Cirpo, and flomax and pain meds. Pain is worse, pt having hematuria, hx of multiple stones in the past.

## 2024-07-02 NOTE — Discharge Instructions (Addendum)
 You are seen in the emergency department today for concerns of flank pain.  Your labs and imaging show what appears to be likely infection in your kidneys.  This is a condition called pyelonephritis.  You are already started on ciprofloxacin  by your primary care provider and I would recommend continuing this medication in its entirety. Follow up closely with your urologist for further evaluation or return to the ER for concerns of new or worsening symptoms.

## 2024-07-02 NOTE — ED Provider Notes (Signed)
 [place] EMERGENCY DEPARTMENT AT [facility] Provider Note   CSN: [id number] Arrival date & time: 07/02/24  1906     Patient presents with: Flank Pain   Jared Chen is a 58 y.o. male.  Patient past history significant for kidney stones, hypertension, prior need for lithotripsy and ureteroscopy who presents to the emergency department today with concerns of flank pain.  Reports he began to experience right-sided flank pain early Friday morning that has been progressive but remains to the right side.  Has had some chills but no reported fevers.  Endorses development of hematuria.  Had testing done by his PCP yesterday and was started on ciprofloxacin  and Flomax as well as pain medications.  States pain is worsening.  Reports having significant difficulty passing stones over 5 to 6 mm.    Flank Pain       Prior to Admission medications   Medication Sig Start Date End Date Taking? Authorizing Provider  acetaminophen (TYLENOL) 325 MG tablet as needed.    [provider]  celecoxib (CELEBREX) 200 MG capsule as needed. 05/29/15   [provider]  famotidine  (PEPCID ) 20 MG tablet Take 1 tablet (20 mg total) by mouth every 12 (twelve) hours as needed for heartburn or indigestion. 04/19/24   Stacia Glendia BRAVO, MD  olmesartan (BENICAR) 20 MG tablet Take 20 mg by mouth daily.    [provider]    Allergies: Demerol [meperidine hcl]    Review of Systems  Genitourinary:  Positive for flank pain.  All other systems reviewed and are negative.   Updated Vital Signs BP (!) 136/95 (BP Location: Right Arm)   Pulse (!) 111   Temp 98 F (36.7 C) (Oral)   Resp 17   SpO2 99%   Physical Exam Vitals and nursing note reviewed.  Constitutional:      General: He is not in acute distress.    Appearance: He is well-developed.  HENT:     Head: Normocephalic and atraumatic.  Eyes:     Conjunctiva/sclera: Conjunctivae normal.  Cardiovascular:     Rate and  Rhythm: Normal rate and regular rhythm.     Heart sounds: No murmur heard. Pulmonary:     Effort: Pulmonary effort is normal. No respiratory distress.     Breath sounds: Normal breath sounds.  Abdominal:     General: Abdomen is flat. Bowel sounds are normal. There is no distension.     Palpations: Abdomen is soft.     Tenderness: There is no abdominal tenderness. There is right CVA tenderness. There is no left CVA tenderness or guarding.  Musculoskeletal:        General: No swelling.     Cervical back: Neck supple.  Skin:    General: Skin is warm and dry.     Capillary Refill: Capillary refill takes less than 2 seconds.  Neurological:     Mental Status: He is alert.  Psychiatric:        Mood and Affect: Mood normal.     (all labs ordered are listed, but only abnormal results are displayed) Labs Reviewed  URINALYSIS, ROUTINE W REFLEX MICROSCOPIC - Abnormal; Notable for the following components:      Result Value   Hgb urine dipstick SMALL (*)    Ketones, ur TRACE (*)    Protein, ur 30 (*)    All other components within normal limits  CBC WITH DIFFERENTIAL/PLATELET - Abnormal; Notable for the following components:   Platelets 142 (*)  Lymphs Abs 0.5 (*)    All other components within normal limits  COMPREHENSIVE METABOLIC PANEL WITH GFR - Abnormal; Notable for the following components:   Sodium 133 (*)    CO2 20 (*)    Glucose, Bld 144 (*)    BUN 23 (*)    All other components within normal limits  URINE CULTURE    EKG: None  Radiology: CT Renal Stone Study Result Date: 07/02/2024 EXAM: CT ABDOMEN AND PELVIS WITHOUT CONTRAST 07/02/2024 07:44:33 PM TECHNIQUE: CT of the abdomen and pelvis was performed without the administration of intravenous contrast. Multiplanar reformatted images are provided for review. Automated exposure control, iterative reconstruction, and/or weight-based adjustment of the mA/kV was utilized to reduce the radiation dose to as low as reasonably  achievable. COMPARISON: Comparison is made to 05/15/2020. CLINICAL HISTORY: Abdominal/flank pain, stone suspected. FINDINGS: LOWER CHEST: No acute abnormality. LIVER: Simple cyst again noted within the upper left hepatic lobe. Liver otherwise unremarkable in this noncontrast examination. GALLBLADDER AND BILE DUCTS: Gallbladder is unremarkable. No biliary ductal dilatation. SPLEEN: No acute abnormality. PANCREAS: No acute abnormality. ADRENAL GLANDS: Indeterminate nodule measuring 14 Hounsfield units of density demonstrates slight interval increase in size since her prior examination, measuring 25 mm in diameter (previously measuring 20 mm). Indeterminate but likely representing a benign adrenal adenoma. Recommend 1 year follow-up adrenal washout CT. If stable  1 year, no further follow-up imaging. KIDNEYS, URETERS AND BLADDER: Moderate bilateral perinephric inflammatory stranding has developed, a nonspecific finding that can be seen in the setting of infectious or inflammatory nephritis, such as pyelonephritis. 1-2 mm punctate nonobstructing calculus noted within the interval region of the right kidney. No additional renal or ureteral calculi. No hydronephrosis. No perinephric fluid collections are seen. The kidneys are normal in size and position. The bladder is unremarkable. GI AND BOWEL: Severe distal colonic diverticulosis, most severe within the sigmoid colon, without superimposed inflammatory change. The stomach, small bowel, and large bowel are otherwise unremarkable. Appendix normal. PERITONEUM AND RETROPERITONEUM: No ascites. No free air. VASCULATURE: Aorta is normal in caliber. LYMPH NODES: No lymphadenopathy. REPRODUCTIVE ORGANS: Moderate prostatic hypertrophy. BONES AND SOFT TISSUES: Osseous structures are age appropriate. No acute bone abnormality. No lytic or blastic bone lesion. No focal soft tissue abnormality. IMPRESSION: 1. Moderate bilateral perinephric inflammatory stranding, nonspecific,  possibly related to infectious or inflammatory nephritis such as pyelonephritis. Correlation with renal function test, urinalysis and urine culture may be helpful for further evaluation. 2. 12 mm punctate nonobstructing right renal calculus without hydronephrosis 3. Left adrenal nodule measuring 25 mm, likely adenoma; recommend adrenal washout CT in 1 year; if stable at 1 year, no further imaging 4. Severe distal colonic diverticulosis without superimposed inflammatory change Electronically signed by: Dorethia Molt MD 07/02/2024 08:01 PM EST RP Workstation: [id number]     Procedures   Medications Ordered in the ED  ketorolac (TORADOL) 15 MG/ML injection 15 mg (15 mg Intravenous Given 07/02/24 1937)  ondansetron (ZOFRAN) injection 4 mg (4 mg Intravenous Given 07/02/24 2045)  cefTRIAXone (ROCEPHIN) 2 g in sodium chloride  0.9 % 100 mL IVPB (0 g Intravenous Stopped 07/02/24 2213)                                    Medical Decision Making Amount and/or Complexity of Data Reviewed Labs: ordered. Radiology: ordered.  Risk Prescription drug management.   This patient presents to the ED for concern of flank pain, this  involves an extensive number of treatment options, and is a complaint that carries with it a high risk of complications and morbidity.  The differential diagnosis includes pyelonephritis, urolithiasis, hydronephrosis, acute cystitis, diverticulitis   Co morbidities that complicate the patient evaluation  Hypertension, history of kidney stones   Additional history obtained:  Additional history obtained from chart review   Lab Tests:  I Ordered, and personally interpreted labs.  The pertinent results include: CBC unremarkable, CMP with mild hyponatremia 133 but no AKI or evidence of dehydration, UA without obvious infection but results are difficult to fully ascertain due to patient currently being on ciprofloxacin  for suspected pyelonephritis, urine culture collected and  pending.   Imaging Studies ordered:  I ordered imaging studies including CT renal stone study I independently visualized and interpreted imaging which showed moderate bilateral perinephric inflammatory stranding, nonspecific, possibly related to infectious or inflammatory nephritis such as pyelonephritis. Correlation with renal function test, urinalysis and urine culture may be helpful for further evaluation. 2. 1-2 mm punctate nonobstructing right renal calculus without hydronephrosis 3. Left adrenal nodule measuring 25 mm, likely adenoma; recommend adrenal washout CT in 1 year; if stable at 1 year, no further imaging 4. Severe distal colonic diverticulosis without superimposed inflammatory change I agree with the radiologist interpretation   Cardiac Monitoring: / EKG:  The patient was maintained on a cardiac monitor.  I personally viewed and interpreted the cardiac monitored which showed an underlying rhythm of: Sinus tachycardia   Consultations Obtained:  I requested consultation with none,  and discussed lab and imaging findings as well as pertinent plan - they recommend: N/A   Problem List / ED Course / Critical interventions / Medication management  Patient presenting emergency department with concerns of flank pain.  He reports that he began experiencing flank pain Thursday evening and was seen by his PCP on Friday and started on ciprofloxacin .  He is taking a total of 3 doses of ciprofloxacin  and was also started on Flomax and pain medication.  Reports pain has worsened and is now developing hematuria.  Reports history of multiple stones on bilateral sides.  He currently follows with [facility] and has appointment set up for Tuesday of this upcoming week in 4 days. Physical exam reveals right flank tenderness.  Abdomen soft and nontender.  Patient's vitals show tachycardia but otherwise unremarkable.  There is no fever at this time. Patient's workup shows thankfully no  leukocytosis and only mild evidence of possible dehydration with a sodium of 133 but otherwise no AKI with normal creatinine at 1.16 and GFR greater than 60.  UA without any signs of infection although difficult to determine due to patient currently being on ciprofloxacin  likely altering results.  Will proceed with urine culture.  CT renal stone study is concerning for moderate bilateral Perry phrenic inflammatory stranding that could be related to infection such as pyelonephritis.  Will give patient a dose of 2 g Rocephin here in the emergency department to hopefully accelerate his symptom response.  Toradol given for pain and Zofran for nausea. On reevaluation, patient reports improvement in symptoms.  He is wishing to be discharged home at this time.  I feel that this is reasonable given no acute signs to suggest urosepsis.  Encourage patient continue with his current prescription of ciprofloxacin .  Return precautions discussed such as concern for new or worsening symptoms.  He is otherwise stable for outpatient follow-up and discharged home. I ordered medication including Toradol, Zofran, Rocephin for pain, nausea, pyelonephritis  Reevaluation of the patient after these medicines showed that the patient improved I have reviewed the patients home medicines and have made adjustments as needed   Social Determinants of Health:  History of recurrent kidney stones   Test / Admission - Considered:  Considered but stable for outpatient follow-up.  Final diagnoses:  Pyelonephritis    ED Discharge Orders     None          Denishia Citro A, PA-C 07/02/24 2242    Stanek, Lawrence S, MD 07/03/24 BEATRIS

## 2024-07-04 LAB — URINE CULTURE
Culture: NO GROWTH
Special Requests: NORMAL
